# Patient Record
Sex: Male | Born: 1965
Health system: Southern US, Community
[De-identification: ages and names within clinical notes are randomized; demographics above are authoritative.]

## PROBLEM LIST (undated history)

## (undated) DIAGNOSIS — G8929 Other chronic pain: Secondary | ICD-10-CM

## (undated) DIAGNOSIS — D649 Anemia, unspecified: Secondary | ICD-10-CM

## (undated) DIAGNOSIS — IMO0001 Reserved for inherently not codable concepts without codable children: Secondary | ICD-10-CM

## (undated) DIAGNOSIS — S0592XA Unspecified injury of left eye and orbit, initial encounter: Secondary | ICD-10-CM

## (undated) DIAGNOSIS — M549 Dorsalgia, unspecified: Secondary | ICD-10-CM

## (undated) DIAGNOSIS — K219 Gastro-esophageal reflux disease without esophagitis: Secondary | ICD-10-CM

## (undated) DIAGNOSIS — R0789 Other chest pain: Secondary | ICD-10-CM

## (undated) DIAGNOSIS — E119 Type 2 diabetes mellitus without complications: Secondary | ICD-10-CM

## (undated) DIAGNOSIS — M1990 Unspecified osteoarthritis, unspecified site: Secondary | ICD-10-CM

## (undated) DIAGNOSIS — G43909 Migraine, unspecified, not intractable, without status migrainosus: Secondary | ICD-10-CM

## (undated) DIAGNOSIS — J45909 Unspecified asthma, uncomplicated: Secondary | ICD-10-CM

## (undated) DIAGNOSIS — F419 Anxiety disorder, unspecified: Secondary | ICD-10-CM

## (undated) HISTORY — DX: Anxiety disorder, unspecified: F41.9

## (undated) HISTORY — DX: Other chronic pain: G89.29

## (undated) HISTORY — DX: Dorsalgia, unspecified: M54.9

## (undated) HISTORY — PX: APPENDECTOMY: SHX54

## (undated) HISTORY — PX: CATARACT EXTRACTION: SUR2

## (undated) HISTORY — DX: Other chest pain: R07.89

## (undated) HISTORY — DX: Migraine, unspecified, not intractable, without status migrainosus: G43.909

## (undated) HISTORY — DX: Unspecified injury of left eye and orbit, initial encounter: S05.92XA

---

## 2004-10-09 ENCOUNTER — Ambulatory Visit (HOSPITAL_COMMUNITY): Admission: RE | Admit: 2004-10-09 | Discharge: 2004-10-09 | Payer: Self-pay | Admitting: Family Medicine

## 2005-06-25 ENCOUNTER — Ambulatory Visit (HOSPITAL_COMMUNITY): Admission: RE | Admit: 2005-06-25 | Discharge: 2005-06-25 | Payer: Self-pay | Admitting: Family Medicine

## 2010-01-16 DIAGNOSIS — F411 Generalized anxiety disorder: Secondary | ICD-10-CM

## 2010-01-17 ENCOUNTER — Ambulatory Visit: Payer: Self-pay | Admitting: Cardiology

## 2010-01-17 ENCOUNTER — Encounter (INDEPENDENT_AMBULATORY_CARE_PROVIDER_SITE_OTHER): Payer: Self-pay | Admitting: *Deleted

## 2010-01-17 DIAGNOSIS — G43909 Migraine, unspecified, not intractable, without status migrainosus: Secondary | ICD-10-CM | POA: Insufficient documentation

## 2010-01-17 DIAGNOSIS — R079 Chest pain, unspecified: Secondary | ICD-10-CM | POA: Insufficient documentation

## 2010-01-19 ENCOUNTER — Encounter (INDEPENDENT_AMBULATORY_CARE_PROVIDER_SITE_OTHER): Payer: Self-pay | Admitting: *Deleted

## 2010-01-19 ENCOUNTER — Encounter: Payer: Self-pay | Admitting: Cardiology

## 2010-01-19 LAB — CONVERTED CEMR LAB
ALT: 51 units/L (ref 0–53)
AST: 25 units/L
AST: 25 units/L (ref 0–37)
Albumin: 4.4 g/dL
Alkaline Phosphatase: 70 units/L
Calcium: 9.6 mg/dL
Calcium: 9.6 mg/dL (ref 8.4–10.5)
Chloride: 105 meq/L (ref 96–112)
Cholesterol: 195 mg/dL
Cholesterol: 195 mg/dL (ref 0–200)
Glucose, Bld: 96 mg/dL
HCT: 48.2 %
HCT: 48.2 % (ref 39.0–52.0)
HDL: 35 mg/dL
Hemoglobin: 16.4 g/dL (ref 13.0–17.0)
LDL Cholesterol: 137 mg/dL — ABNORMAL HIGH (ref 0–99)
Lymphs Abs: 1.5 10*3/uL (ref 0.7–4.0)
MCHC: 34 g/dL (ref 30.0–36.0)
Monocytes Absolute: 0.9 10*3/uL (ref 0.1–1.0)
Monocytes Relative: 11 % (ref 3–12)
Neutro Abs: 5 10*3/uL (ref 1.7–7.7)
Neutrophils Relative %: 61 % (ref 43–77)
Platelets: 297 10*3/uL
RBC: 5.15 M/uL (ref 4.22–5.81)
Sodium: 140 meq/L (ref 135–145)
TSH: 1.908 microintl units/mL
TSH: 1.908 microintl units/mL (ref 0.350–4.500)
Total Protein: 7.1 g/dL (ref 6.0–8.3)
Triglycerides: 117 mg/dL (ref <150)
WBC: 8.1 10*3/uL

## 2010-01-22 ENCOUNTER — Encounter (INDEPENDENT_AMBULATORY_CARE_PROVIDER_SITE_OTHER): Payer: Self-pay | Admitting: *Deleted

## 2010-01-24 ENCOUNTER — Ambulatory Visit: Payer: Self-pay | Admitting: Cardiology

## 2010-01-24 ENCOUNTER — Encounter (INDEPENDENT_AMBULATORY_CARE_PROVIDER_SITE_OTHER): Payer: Self-pay | Admitting: *Deleted

## 2010-01-25 ENCOUNTER — Encounter (INDEPENDENT_AMBULATORY_CARE_PROVIDER_SITE_OTHER): Payer: Self-pay | Admitting: *Deleted

## 2010-02-01 ENCOUNTER — Encounter (INDEPENDENT_AMBULATORY_CARE_PROVIDER_SITE_OTHER): Payer: Self-pay | Admitting: *Deleted

## 2010-02-07 ENCOUNTER — Ambulatory Visit (HOSPITAL_COMMUNITY): Admission: RE | Admit: 2010-02-07 | Discharge: 2010-02-07 | Payer: Self-pay | Admitting: Family Medicine

## 2010-11-19 NOTE — Letter (Signed)
Summary: [place] Results Engineer, agricultural at [facility]  [address] [place] [address]   Phone: [phone]  Fax: [phone]      January 22, 2010 MRN: [id number]   Dakota Anthony [address] [place], [place]  [address]   Dear [name],  Your test ordered by Selena Batten has been reviewed by your physician (or physician assistant) and was found to be normal or stable. Your physician (or physician assistant) felt no changes were needed at this time.  ____ Echocardiogram  ____ Cardiac Stress Test  _x___ Lab Work  ____ Peripheral vascular study of arms, legs or neck  ____ CT scan or X-ray  ____ Lung or Breathing test  ____ Other:  No change in medical treatment at this time, per Dr. Dietrich Pates.  Enclosed is a copy of your labwork for your records.  Thank you, [name] [name] RN    [place] [name], MD, Lenise Arena.C.Gaylord Shih, MD, F.A.C.C Lewayne Bunting, MD, F.A.C.C Nona Dell, MD, F.A.C.C Charlton Haws, MD, Lenise Arena.C.C

## 2010-11-19 NOTE — Assessment & Plan Note (Signed)
Summary: **PER DR.STEVE LUKING FOR CHEST PAIN/ABNORMAL EKG/TG   Visit Type:  Initial Consult Primary Provider:  Dr. Loran Senters   History of Present Illness: Initial visit for this very pleasant 45 year old gentleman kindly referred by Dr. Gerda Diss for evaluation of chest discomfort.  Mr. Lefebre has no history of cardiovascular disease and has never been evaluated by a cardiologist.  He did undergo treadmill testing 6 years ago with a negative result.  In recent weeks, he has been troubled by a sense of anxiety, has been perseverating on minor issues and has had difficulty sleeping.  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  Symptoms resolve with rest.  He was recently evaluated i.e. his primary care physician for these symptoms and was provided with a prescription for Xanax.  Insomnia has improved as has irritability.  EKG  Procedure date:  10-Feb-2010  Findings:      Normal sinus rhythm Right ventricular conduction delay Delayed R-wave progression Minor J-point elevation Otherwise normal EKG  Comparison with a prior tracing obtained in Dr. Fletcher Anon office shows less impressive changes of early repolarization on the current exam   Current Medications (verified): 1)  Aspir-Low 81 Mg Tbec (Aspirin) .... Take 1 Tab Daily 2)  Alprazolam 0.5 Mg Tabs (Alprazolam) .... Take 1 Tab Two Times A Day  Allergies (verified): No Known Drug Allergies  Past History:  Family History: Last updated: 02-10-2010 Father died at age 40 with a history of hepatic cirrhosis and the development of carcinoma. Mother died due to renal failure Siblings-one brother and one sister who are alive and well.  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  Family History: Father died at age 93 with a history of hepatic cirrhosis and the development of carcinoma. Mother died due to renal failure Siblings-one brother and one sister who are alive and well.  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  He requires corrective lenses for near vision.  There is a history of mild asthma.  All other systems reviewed and are negative.  Vital Signs:  Patient profile:   45 year old male Height:      73 inches Weight:      185 pounds BMI:     24.50 Pulse rate:   76 / minute BP sitting:   117 / 74  (right arm)  Vitals Entered By: Dreama Saa, CNA (February 10, 2010 11:13 AM)  Physical Exam  General:    Proportionatet weight and height; well-developed; no acute distress: HEENT-[place]/AT;anisocoria with the left pupil substantially larger but both reacting well to light and accommodation;  EOM intact; conjunctiva and lids nl:  Neck-No JVD; no carotid bruits: Endocrine-mild thyromegaly: Lungs-No tachypnea, clear without rales, rhonchi or wheezes: CV-normal PMI; normal S1 and S2; S4 present Abdomen-BS normal; soft and non-tender without masses or organomegaly: MS-No deformities, cyanosis or  clubbing: Neurologic-Nl cranial nerves; symmetric strength and tone: Skin- Warm, no sig. lesions: Extremities-Nl distal pulses; no edema    Impression & Recommendations:  Problem # 1:  CHEST PAIN (ICD-786.50) Mr. Eugine Bubb presents with atypical chest  discomfort.  His EKG abnormalities likely represent normal variants.  A stress test certainly is warranted to exclude ischemia and to evaluate his other symptoms, most notably exertional dyspnea and palpitations.  Basic blood tests including a TSH have also been obtained.  I will reassess this nice gentleman after the studies have been completed to determine whether any additional assessment or treatment is warranted.  Problem # 2:  ANXIETY (ICD-300.00) He appears to be responding well to Xanax.  Other Orders: Treadmill (Treadmill) Future Orders: T-Comprehensive Metabolic Panel ([id number]) ... 01/21/2010 T-CBC w/Diff ([id number]) ... 01/21/2010 T-Lipid Profile [id number]) ... 01/21/2010 T-TSH [id number]) ... 01/21/2010  Patient Instructions: 1)  Your physician recommends that you schedule a follow-up appointment in: 2 weeks 2)  Your physician recommends that you return for lab work in: next week 3)  Your physician has requested that you have an exercise tolerance test.  For further information please visit [email or web address]  Please also follow instruction sheet, as given.

## 2010-11-19 NOTE — Letter (Signed)
Summary: Graded Exercise Tolerance Test  [facility]  [address] [place] [address]   Phone: [phone]  Fax: [phone]        Graded Exercise Tolerance Test    Andria Meuse  Appointment Date:_  Appointment Time:_   Your doctor has ordered a stress test to help determine the condition of your  heart during exercise. If you take blood pressure medicine , ask your doctor if you should take it the day of your test. You may eat a light meal before your test.  Please be sure to bring the copy of your order with you.   You should dress comfortably, for example: Sweat pants, shorts, or skirt, Loose                                                                                                       short sleeved T-shirt, Rubber soled lace-up shoes (tennis shoes)  You will need to arrive 15 minutes before your appointment time. You will also need to enter at the Main Entrance of the hospital and go to the registration desk.   You will need to plan on being at the hospital for one hour from registration for this appointment.

## 2010-11-19 NOTE — Letter (Signed)
Summary: Appointment - Missed  Dakota Anthony at [facility]  [address], [place] [address]   Phone: [phone]  Fax: [phone]     February 01, 2010 MRN: [id number]   Dakota Anthony [address] [place], [place]  [address]   Dear Mr. Dakota Anthony,  Our records indicate you missed your appointment on   02/01/10                     with Dr.       .       Daleen Squibb                             It is very important that we reach you to reschedule this appointment. We look forward to participating in your health care needs. Please contact us at the number listed above at your earliest convenience to reschedule this appointment.     Sincerely,    Glass blower/designer

## 2010-11-19 NOTE — Letter (Signed)
Summary: [place] Results Engineer, agricultural at [facility]  [address], [place] [address]   Phone: [phone]  Fax: [phone]      January 25, 2010 MRN: [id number]   Dakota Anthony [address] [place], [place]  [address]   Dear Dakota Anthony,  Your test ordered by Selena Batten has been reviewed by your physician (or physician assistant) and was found to be normal or stable. Your physician (or physician assistant) felt no changes were needed at this time.  ____ Echocardiogram  __x__ Cardiac Stress Test  ____ Lab Work  ____ Peripheral vascular study of arms, legs or neck  ____ CT scan or X-ray  ____ Lung or Breathing test  ____ Other:  No change in medical treatment at this time, per Dr. Dietrich Pates.  Thank you, Shemekia Patane [name] RN    Stanly [name], MD, Lenise Arena.C.Gaylord Shih, MD, F.A.C.C Lewayne Bunting, MD, F.A.C.C Nona Dell, MD, F.A.C.C Charlton Haws, MD, Lenise Arena.C.C

## 2010-11-19 NOTE — Letter (Signed)
Summary: [place] [facility] Work Engineer, agricultural at [facility]  [address] [place] [address]   Phone: [phone]  Fax: [phone]     January 17, 2010 MRN: [id number]   [place] [address] [place], [place]  [address]      YOUR LAB WORK IS DUE   ________MONDAY_________________________________  Please go to [facility], located across the street from [facility] on the second floor.  Hours are Monday - Friday 7am until 7:30pm         Saturday 8am until 12noon    _x_  DO NOT EAT OR DRINK AFTER MIDNIGHT EVENING PRIOR TO LABWORK  __ YOUR LABWORK IS NOT FASTING --YOU MAY EAT PRIOR TO LABWORK

## 2010-11-19 NOTE — Miscellaneous (Signed)
Summary: LABS 4/2/2011CBCD,CMP,TSH,01/19/2010  Clinical Lists Changes  Observations: Added new observation of CALCIUM: 9.6 mg/dL (45/40/9811 91:47) Added new observation of ALBUMIN: 4.4 g/dL (82/95/6213 08:65) Added new observation of PROTEIN, TOT: 7.1 g/dL (78/46/9629 52:84) Added new observation of SGPT (ALT): 51 units/L (01/19/2010 11:38) Added new observation of SGOT (AST): 25 units/L (01/19/2010 11:38) Added new observation of ALK PHOS: 70 units/L (01/19/2010 11:38) Added new observation of CREATININE: 1.16 mg/dL (13/24/4010 27:25) Added new observation of BUN: 15 mg/dL (36/64/4034 74:25) Added new observation of BG RANDOM: 96 mg/dL (95/63/8756 43:32) Added new observation of CO2 PLSM/SER: 24 meq/L (01/19/2010 11:38) Added new observation of CL SERUM: 105 meq/L (01/19/2010 11:38) Added new observation of K SERUM: 4.4 meq/L (01/19/2010 11:38) Added new observation of NA: 140 meq/L (01/19/2010 11:38) Added new observation of LDL: 137 mg/dL (95/18/8416 60:63) Added new observation of HDL: 35 mg/dL (01/60/1093 23:55) Added new observation of TRIGLYC TOT: 117 mg/dL (73/22/0254 27:06) Added new observation of CHOLESTEROL: 195 mg/dL (23/76/2831 51:76) Added new observation of PLATELETK/UL: 297 K/uL (01/19/2010 11:38) Added new observation of MCV: 93.6 fL (01/19/2010 11:38) Added new observation of HCT: 48.2 % (01/19/2010 11:38) Added new observation of HGB: 16.4 g/dL (16/04/3709 62:69) Added new observation of WBC COUNT: 8.1 10*3/microliter (01/19/2010 11:38) Added new observation of TSH: 1.908 microintl units/mL (01/19/2010 11:38)

## 2011-03-07 NOTE — Procedures (Signed)
NAME:  Dakota Anthony, Dakota Anthony NO.:  [id number]   MEDICAL RECORD NO.:  [id number]          PATIENT TYPE:  OUT   LOCATION:  [facility]                          FACILITY:  [facility]   PHYSICIAN:  Donna Bernard, M.D.DATE OF BIRTH:  10-25-1965   DATE OF PROCEDURE:  DATE OF DISCHARGE:                                    STRESS TEST   INDICATION:  Atypical chest discomfort.   HOSPITAL COURSE:  A stress test was performed at standard [facility] protocol.  Resting EKG revealed normal sinus rhythm with no significant STT changes.  The patient tolerated the first three stages very well.  In fact, we had to  go all the way to the end of the fourth stage before he reached a submaximal  predicted heart rate of 154.  The patient went in fact 20 seconds into the  5th stage.  He reached a maximum heart rate of 174.  At this point, at 0.08  seconds past the J point, his ST segment was slightly depressed less than 1  mm with a sharply ascending slope which basically makes it within normal  limits.   IMPRESSION:  Normal adequate stress test.   PLAN:  1.  The patient is encouraged to maintain his level of exercise and physical      condition.  2.  Advil as needed for chest discomfort.  3.  The chance that this discomfort is cardiac is very low, and the patient      is advised of such.     Kristine Royal   WSL/MEDQ  D:  10/09/2004  T:  10/09/2004  Job:  [id number]

## 2011-08-12 ENCOUNTER — Encounter: Payer: Self-pay | Admitting: Cardiology

## 2011-09-08 ENCOUNTER — Other Ambulatory Visit: Payer: Self-pay | Admitting: Family Medicine

## 2011-09-08 DIAGNOSIS — R109 Unspecified abdominal pain: Secondary | ICD-10-CM

## 2011-09-09 ENCOUNTER — Ambulatory Visit (HOSPITAL_COMMUNITY)
Admission: RE | Admit: 2011-09-09 | Discharge: 2011-09-09 | Disposition: A | Discharge status: Home / Self Care | Payer: 59 | Source: Ambulatory Visit | Attending: Family Medicine | Admitting: Family Medicine

## 2011-09-09 DIAGNOSIS — R1011 Right upper quadrant pain: Secondary | ICD-10-CM | POA: Insufficient documentation

## 2011-09-09 DIAGNOSIS — K8020 Calculus of gallbladder without cholecystitis without obstruction: Secondary | ICD-10-CM | POA: Insufficient documentation

## 2011-09-09 DIAGNOSIS — R109 Unspecified abdominal pain: Secondary | ICD-10-CM

## 2011-10-09 NOTE — H&P (Signed)
NTS SOAP Note  Vital Signs:  Vitals as of: 10/09/2011: Systolic 133: Diastolic 74: Heart Rate 75: Temp 61F: Height 66ft 1in: Weight 188Lbs 0 Ounces: Pain Level 9: BMI 25  BMI : 24.8 kg/m2  Subjective: This 45 Years 64 Months old Male presents for of  ABDOMINAL ISSUES: ,Has been having right upper quadrant abdominal pain, bloating, and fatty food intolerance for the past six months.  Increasing in frequency.  No fever, chills, jaundice.  U/S of gallbladder reveals cholelithiasis with normal common bile duct.  Review of Symptoms:  Constitutional:unremarkable Head:unremarkable Eyes:unremarkable Nose/Mouth/Throat:unremarkable Cardiovascular:unremarkable Respiratory:unremarkable Gastrointestinal:unremarkable Genitourinary:unremarkable Musculoskeletal:unremarkable Skin:unremarkable Hematolgic/Lymphatic:unremarkable Allergic/Immunologic:unremarkable   Past Medical History:Reviewed   Past Medical History  Surgical History: none Medical Problems: none Psychiatric History:  Anxiety Allergies: nkda Medications: zanax, baby ASA   Social History:Reviewed   Social History  Preferred Language: English ([place]) Race:  White Ethnicity: Not Hispanic / Latino Age: 45 Years 11 Months Marital Status:  M Alcohol:  No Recreational drug(s):  No   Smoking Status: Never smoker reviewed on 10/09/2011  Family History:Reviewed   Family History              Father:  Cancer             Mother:  Diabetes Type II    Objective Information: General:Well appearing, well nourished in no distress. No scleral icterus Heart:RRR, no murmur or gallop.  Normal S1, S2.  No S3, S4.  Lungs:CTA bilaterally, no wheezes, rhonchi, rales.  Breathing unlabored. Abdomen:Soft, slightly tender in right upper quadrant to deep palpation, ND, normal bowel sounds, no HSM, no masses.  No peritoneal signs.  Assessment:Biliary colic,  cholelithiasis  Diagnosis &amp; Procedure: DiagnosisCode: 574.20, ProcedureCode: 54098,    Plan:Scheduled for laparoscopic cholecystectomy for 10/31/11.   Patient Education:Alternative treatments to surgery were discussed with patient (and family).Risks and benefits  of procedure were fully explained to the patient (and family) who gave informed consent. Patient/family questions were addressed.  Follow-up:Pending Surgery

## 2011-10-27 ENCOUNTER — Encounter (HOSPITAL_COMMUNITY)
Admission: RE | Admit: 2011-10-27 | Discharge: 2011-10-27 | Disposition: A | Discharge status: Home / Self Care | Payer: 59 | Source: Ambulatory Visit | Attending: General Surgery | Admitting: General Surgery

## 2011-10-27 ENCOUNTER — Encounter (HOSPITAL_COMMUNITY): Payer: Self-pay

## 2011-10-27 LAB — DIFFERENTIAL
Basophils Absolute: 0.1 10*3/uL (ref 0.0–0.1)
Lymphocytes Relative: 21 % (ref 12–46)
Monocytes Relative: 6 % (ref 3–12)
Neutro Abs: 5.3 10*3/uL (ref 1.7–7.7)
Neutrophils Relative %: 67 % (ref 43–77)

## 2011-10-27 LAB — BASIC METABOLIC PANEL
BUN: 18 mg/dL (ref 6–23)
Calcium: 10 mg/dL (ref 8.4–10.5)
GFR calc non Af Amer: 78 mL/min — ABNORMAL LOW (ref >90)
Sodium: 144 mEq/L (ref 135–145)

## 2011-10-27 LAB — CBC
HCT: 44 % (ref 39.0–52.0)
Hemoglobin: 14.7 g/dL (ref 13.0–17.0)
MCV: 92.8 fL (ref 78.0–100.0)
Platelets: 325 10*3/uL (ref 150–400)
RBC: 4.74 MIL/uL (ref 4.22–5.81)
RDW: 12.3 % (ref 11.5–15.5)

## 2011-10-27 LAB — HEPATIC FUNCTION PANEL
Alkaline Phosphatase: 89 U/L (ref 39–117)
Bilirubin, Direct: 0.1 mg/dL (ref 0.0–0.3)
Total Bilirubin: 0.4 mg/dL (ref 0.3–1.2)
Total Protein: 6.9 g/dL (ref 6.0–8.3)

## 2011-10-27 LAB — SURGICAL PCR SCREEN: Staphylococcus aureus: NEGATIVE

## 2011-10-27 NOTE — Patient Instructions (Addendum)
20 Dakota Anthony  10/27/2011   Your procedure is scheduled on:  Friday, 10/31/11  [facility] [place] [facility].  Call this number if you have problems the morning of surgery: [phone]   Remember:   Do not eat food:After Midnight.  May have clear liquids:until Midnight .  Clear liquids include soda, tea, black coffee, apple or grape juice, broth.  Take these medicines the morning of surgery with A SIP OF WATER: xanax   Do not wear jewelry, make-up or nail polish.  Do not wear lotions, powders, or perfumes. You may wear deodorant.  Do not bring valuables to the hospital.  Contacts, dentures or bridgework may not be worn into surgery.  Leave suitcase in the car. After surgery it may be brought to your room.  For patients admitted to the hospital, checkout time is 11:00 AM the day of discharge.   Patients discharged the day of surgery will not be allowed to drive home.  Name and phone number of your driver: driver  Special Instructions: CHG Shower Use Special Wash: 1/2 bottle night before surgery and 1/2 bottle morning of surgery.   Please read over the following fact sheets that you were given: Pain Booklet, MRSA Information, Surgical Site Infection Prevention, Anesthesia Post-op Instructions and Care and Recovery After Surgery     Laparoscopic Cholecystectomy Care After These instructions give you information on caring for yourself after your procedure. Your doctor may also give you more specific instructions. Call your doctor if you have any problems or questions after your procedure. HOME CARE  Change your bandages (dressings) as told by your doctor.   Keep the wound dry and clean. Wash the wound gently with soap and water. Pat the wound dry with a clean towel.   Do not take baths, swim, or use hot tubs for 10 days, or as told by your doctor.   Only take medicine as told by your doctor.   Eat a normal diet as told by your doctor.   Do not lift anything heavier than 25  pounds (11.5 kg), or as told by your doctor.   Do not play contact sports for 1 week, or as told by your doctor.  GET HELP RIGHT AWAY IF:   Your wound is red, puffy (swollen), or painful.   You have yellowish-white fluid (pus) coming from the wound.   You have fluid draining from the wound for more than 1 day.   You have a bad smell coming from the wound.   Your wound breaks open.   You have a rash.   You have trouble breathing.   You have chest pain.   You have a bad reaction to your medicine.   You have a fever.   You have pain in the shoulders (shoulder strap areas).   You feel dizzy or pass out (faint).   You have severe belly (abdominal) pain.   You feel sick to your stomach (nauseous) or throw up (vomit) for more than 1 day.  MAKE SURE YOU:  Understand these instructions.   Will watch your condition.   Will get help right away if you are not doing well or get worse.  Document Released: 07/15/2008 Document Revised: 06/18/2011 Document Reviewed: 03/25/2011 [facility] Patient Information 2012 [place], [place].

## 2011-10-31 ENCOUNTER — Other Ambulatory Visit: Payer: Self-pay | Admitting: General Surgery

## 2011-10-31 ENCOUNTER — Encounter (HOSPITAL_COMMUNITY): Payer: Self-pay | Admitting: Anesthesiology

## 2011-10-31 ENCOUNTER — Encounter (HOSPITAL_COMMUNITY): Payer: Self-pay | Admitting: *Deleted

## 2011-10-31 ENCOUNTER — Ambulatory Visit (HOSPITAL_COMMUNITY)
Admission: RE | Admit: 2011-10-31 | Discharge: 2011-10-31 | Disposition: A | Discharge status: Home / Self Care | Payer: 59 | Source: Ambulatory Visit | Attending: General Surgery | Admitting: General Surgery

## 2011-10-31 ENCOUNTER — Ambulatory Visit (HOSPITAL_COMMUNITY): Payer: 59 | Admitting: Anesthesiology

## 2011-10-31 ENCOUNTER — Encounter (HOSPITAL_COMMUNITY)
Admission: RE | Disposition: A | Discharge status: Home / Self Care | Payer: Self-pay | Source: Ambulatory Visit | Attending: General Surgery

## 2011-10-31 DIAGNOSIS — Z01812 Encounter for preprocedural laboratory examination: Secondary | ICD-10-CM | POA: Insufficient documentation

## 2011-10-31 DIAGNOSIS — K8010 Calculus of gallbladder with chronic cholecystitis without obstruction: Secondary | ICD-10-CM | POA: Insufficient documentation

## 2011-10-31 HISTORY — PX: CHOLECYSTECTOMY: SHX55

## 2011-10-31 SURGERY — LAPAROSCOPIC CHOLECYSTECTOMY
Anesthesia: General | Site: Abdomen | Wound class: Clean Contaminated

## 2011-10-31 MED ORDER — BUPIVACAINE HCL 0.5 % IJ SOLN
INTRAMUSCULAR | Status: DC | PRN
  Administered 2011-10-31: 10 mL

## 2011-10-31 MED ORDER — GLYCOPYRROLATE 0.2 MG/ML IJ SOLN
INTRAMUSCULAR | Status: AC
  Filled 2011-10-31: qty 1

## 2011-10-31 MED ORDER — FENTANYL CITRATE 0.05 MG/ML IJ SOLN
INTRAMUSCULAR | Status: AC
  Administered 2011-10-31: 25 ug via INTRAVENOUS
  Filled 2011-10-31: qty 2

## 2011-10-31 MED ORDER — BUPIVACAINE HCL (PF) 0.5 % IJ SOLN
INTRAMUSCULAR | Status: AC
  Filled 2011-10-31: qty 30

## 2011-10-31 MED ORDER — LIDOCAINE HCL (CARDIAC) 10 MG/ML IV SOLN
INTRAVENOUS | Status: DC | PRN
  Administered 2011-10-31: 10 mg via INTRAVENOUS

## 2011-10-31 MED ORDER — NEOSTIGMINE METHYLSULFATE 1 MG/ML IJ SOLN
INTRAMUSCULAR | Status: DC | PRN
  Administered 2011-10-31: 2 mg via INTRAVENOUS

## 2011-10-31 MED ORDER — ACETAMINOPHEN 325 MG PO TABS: 325.0000 mg | ORAL_TABLET | ORAL | Status: DC | PRN

## 2011-10-31 MED ORDER — ONDANSETRON HCL 4 MG/2ML IJ SOLN: 4.0000 mg | Freq: Once | INTRAMUSCULAR | Status: DC | PRN

## 2011-10-31 MED ORDER — MIDAZOLAM HCL 2 MG/2ML IJ SOLN
INTRAMUSCULAR | Status: AC
  Administered 2011-10-31: 2 mg via INTRAVENOUS
  Filled 2011-10-31: qty 2

## 2011-10-31 MED ORDER — ENOXAPARIN SODIUM 40 MG/0.4ML ~~LOC~~ SOLN
SUBCUTANEOUS | Status: AC
  Administered 2011-10-31: 40 mg via SUBCUTANEOUS
  Filled 2011-10-31: qty 0.4

## 2011-10-31 MED ORDER — ROCURONIUM BROMIDE 100 MG/10ML IV SOLN
INTRAVENOUS | Status: DC | PRN
  Administered 2011-10-31: 5 mg via INTRAVENOUS

## 2011-10-31 MED ORDER — GLYCOPYRROLATE 0.2 MG/ML IJ SOLN
INTRAMUSCULAR | Status: AC
  Administered 2011-10-31: 0.2 mg via INTRAVENOUS
  Filled 2011-10-31: qty 1

## 2011-10-31 MED ORDER — ENOXAPARIN SODIUM 40 MG/0.4ML ~~LOC~~ SOLN
40.0000 mg | Freq: Once | SUBCUTANEOUS | Status: AC
  Administered 2011-10-31: 40 mg via SUBCUTANEOUS

## 2011-10-31 MED ORDER — CEFAZOLIN SODIUM-DEXTROSE 2-3 GM-% IV SOLR
INTRAVENOUS | Status: AC
  Filled 2011-10-31: qty 50

## 2011-10-31 MED ORDER — FENTANYL CITRATE 0.05 MG/ML IJ SOLN
25.0000 ug | INTRAMUSCULAR | Status: DC | PRN
  Administered 2011-10-31 (×2): 25 ug via INTRAVENOUS

## 2011-10-31 MED ORDER — LACTATED RINGERS IV SOLN
INTRAVENOUS | Status: DC
  Administered 2011-10-31: 1000 mL via INTRAVENOUS

## 2011-10-31 MED ORDER — KETOROLAC TROMETHAMINE 30 MG/ML IJ SOLN
30.0000 mg | Freq: Once | INTRAMUSCULAR | Status: AC
  Administered 2011-10-31: 30 mg via INTRAVENOUS

## 2011-10-31 MED ORDER — KETOROLAC TROMETHAMINE 30 MG/ML IJ SOLN
INTRAMUSCULAR | Status: AC
  Filled 2011-10-31: qty 1

## 2011-10-31 MED ORDER — ONDANSETRON HCL 4 MG/2ML IJ SOLN
4.0000 mg | Freq: Once | INTRAMUSCULAR | Status: AC
  Administered 2011-10-31: 4 mg via INTRAVENOUS

## 2011-10-31 MED ORDER — ROCURONIUM BROMIDE 50 MG/5ML IV SOLN
INTRAVENOUS | Status: AC
  Filled 2011-10-31: qty 1

## 2011-10-31 MED ORDER — FENTANYL CITRATE 0.05 MG/ML IJ SOLN
INTRAMUSCULAR | Status: AC
  Administered 2011-10-31: 25 ug via INTRAVENOUS
  Filled 2011-10-31: qty 5

## 2011-10-31 MED ORDER — FENTANYL CITRATE 0.05 MG/ML IJ SOLN
INTRAMUSCULAR | Status: DC | PRN
  Administered 2011-10-31 (×3): 50 ug via INTRAVENOUS

## 2011-10-31 MED ORDER — LIDOCAINE HCL (PF) 1 % IJ SOLN
INTRAMUSCULAR | Status: AC
  Filled 2011-10-31: qty 5

## 2011-10-31 MED ORDER — CEFAZOLIN SODIUM-DEXTROSE 2-3 GM-% IV SOLR
2.0000 g | INTRAVENOUS | Status: AC
  Administered 2011-10-31: 2 g via INTRAVENOUS

## 2011-10-31 MED ORDER — ONDANSETRON HCL 4 MG/2ML IJ SOLN
INTRAMUSCULAR | Status: AC
  Administered 2011-10-31: 4 mg via INTRAVENOUS
  Filled 2011-10-31: qty 2

## 2011-10-31 MED ORDER — PROPOFOL 10 MG/ML IV EMUL
INTRAVENOUS | Status: DC | PRN
  Administered 2011-10-31: 50 mg via INTRAVENOUS
  Administered 2011-10-31: 150 mg via INTRAVENOUS

## 2011-10-31 MED ORDER — MIDAZOLAM HCL 2 MG/2ML IJ SOLN
1.0000 mg | INTRAMUSCULAR | Status: DC | PRN
  Administered 2011-10-31: 2 mg via INTRAVENOUS

## 2011-10-31 MED ORDER — OXYCODONE-ACETAMINOPHEN 7.5-325 MG PO TABS: 1.0000 | ORAL_TABLET | ORAL | Status: AC | PRN

## 2011-10-31 MED ORDER — GLYCOPYRROLATE 0.2 MG/ML IJ SOLN
0.2000 mg | Freq: Once | INTRAMUSCULAR | Status: AC | PRN
  Administered 2011-10-31: 0.2 mg via INTRAVENOUS

## 2011-10-31 MED ORDER — HEMOSTATIC AGENTS (NO CHARGE) OPTIME
TOPICAL | Status: DC | PRN
  Administered 2011-10-31: 1 via TOPICAL

## 2011-10-31 MED ORDER — PROPOFOL 10 MG/ML IV EMUL
INTRAVENOUS | Status: AC
  Filled 2011-10-31: qty 20

## 2011-10-31 MED ORDER — GLYCOPYRROLATE 0.2 MG/ML IJ SOLN
INTRAMUSCULAR | Status: DC | PRN
  Administered 2011-10-31: .4 mg via INTRAVENOUS

## 2011-10-31 MED ORDER — SODIUM CHLORIDE 0.9 % IR SOLN
Status: DC | PRN
  Administered 2011-10-31: 1000 mL

## 2011-10-31 MED ORDER — NEOSTIGMINE METHYLSULFATE 1 MG/ML IJ SOLN
INTRAMUSCULAR | Status: AC
  Filled 2011-10-31: qty 10

## 2011-10-31 SURGICAL SUPPLY — 35 items
APPLIER CLIP ROT 10 11.4 M/L (STAPLE) ×2
APR CLP MED LRG 11.4X10 (STAPLE) ×1
BAG HAMPER (MISCELLANEOUS) ×2 IMPLANT
BAG SPEC RTRVL LRG 6X4 10 (ENDOMECHANICALS) ×1
CLIP APPLIE ROT 10 11.4 M/L (STAPLE) ×1 IMPLANT
CLOTH BEACON ORANGE TIMEOUT ST (SAFETY) ×2 IMPLANT
COVER LIGHT HANDLE STERIS (MISCELLANEOUS) ×4 IMPLANT
DECANTER SPIKE VIAL GLASS SM (MISCELLANEOUS) ×2 IMPLANT
DURAPREP 26ML APPLICATOR (WOUND CARE) ×2 IMPLANT
ELECT REM PT RETURN 9FT ADLT (ELECTROSURGICAL) ×2
ELECTRODE REM PT RTRN 9FT ADLT (ELECTROSURGICAL) ×1 IMPLANT
FILTER SMOKE EVAC LAPAROSHD (FILTER) ×2 IMPLANT
FORMALIN 10 PREFIL 120ML (MISCELLANEOUS) ×2 IMPLANT
GLOVE BIO SURGEON STRL SZ7.5 (GLOVE) ×2 IMPLANT
GLOVE ECLIPSE 6.5 STRL STRAW (GLOVE) ×1 IMPLANT
GLOVE ECLIPSE 7.0 STRL STRAW (GLOVE) ×1 IMPLANT
GLOVE EXAM NITRILE MD LF STRL (GLOVE) ×1 IMPLANT
GLOVE INDICATOR 7.0 STRL GRN (GLOVE) ×2 IMPLANT
GOWN STRL REIN XL XLG (GOWN DISPOSABLE) ×6 IMPLANT
HEMOSTAT SNOW SURGICEL 2X4 (HEMOSTASIS) ×2 IMPLANT
INST SET LAPROSCOPIC AP (KITS) ×2 IMPLANT
KIT ROOM TURNOVER APOR (KITS) ×2 IMPLANT
KIT TROCAR LAP CHOLE (TROCAR) ×2 IMPLANT
MANIFOLD NEPTUNE II (INSTRUMENTS) ×2 IMPLANT
NS IRRIG 1000ML POUR BTL (IV SOLUTION) ×2 IMPLANT
PACK LAP CHOLE LZT030E (CUSTOM PROCEDURE TRAY) ×2 IMPLANT
PAD ARMBOARD 7.5X6 YLW CONV (MISCELLANEOUS) ×2 IMPLANT
POUCH SPECIMEN RETRIEVAL 10MM (ENDOMECHANICALS) ×2 IMPLANT
SET BASIN LINEN APH (SET/KITS/TRAYS/PACK) ×2 IMPLANT
SPONGE GAUZE 2X2 8PLY STRL LF (GAUZE/BANDAGES/DRESSINGS) ×5 IMPLANT
STAPLER VISISTAT (STAPLE) ×2 IMPLANT
SUT VICRYL 0 UR6 27IN ABS (SUTURE) ×2 IMPLANT
TAPE CLOTH SURG 4X10 WHT LF (GAUZE/BANDAGES/DRESSINGS) ×1 IMPLANT
WARMER LAPAROSCOPE (MISCELLANEOUS) ×2 IMPLANT
YANKAUER SUCT 12FT TUBE ARGYLE (SUCTIONS) ×2 IMPLANT

## 2011-10-31 NOTE — Anesthesia Preprocedure Evaluation (Signed)
Anesthesia Evaluation  Patient identified by MRN, date of birth, ID band Patient awake    Reviewed: Allergy & Precautions, H&P , NPO status , Patient's Chart, lab work & pertinent test results  Airway Mallampati: I TM Distance: >3 FB Neck ROM: Full    Dental No notable dental hx.    Pulmonary neg pulmonary ROS,    Pulmonary exam normal       Cardiovascular neg cardio ROS Regular Normal    Neuro/Psych  Headaches, Anxiety    GI/Hepatic negative GI ROS, Neg liver ROS,   Endo/Other  Negative Endocrine ROS  Renal/GU negative Renal ROS  Genitourinary negative   Musculoskeletal negative musculoskeletal ROS (+)   Abdominal Normal abdominal exam  (+)   Peds  Hematology negative hematology ROS (+)   Anesthesia Other Findings   Reproductive/Obstetrics                           Anesthesia Physical Anesthesia Plan  ASA: I  Anesthesia Plan: General   Post-op Pain Management:    Induction: Intravenous, Rapid sequence and Cricoid pressure planned  Airway Management Planned: Oral ETT  Additional Equipment:   Intra-op Plan:   Post-operative Plan: Extubation in OR  Informed Consent: I have reviewed the patients History and Physical, chart, labs and discussed the procedure including the risks, benefits and alternatives for the proposed anesthesia with the patient or authorized representative who has indicated his/her understanding and acceptance.   Dental advisory given  Plan Discussed with: CRNA  Anesthesia Plan Comments:         Anesthesia Quick Evaluation

## 2011-10-31 NOTE — Interval H&P Note (Signed)
History and Physical Interval Note:  10/31/2011 7:24 AM  Dakota Anthony  has presented today for surgery, with the diagnosis of [place] of gallbladder with other cholecystitis, without mention of obstruction   The various methods of treatment have been discussed with the patient and family. After consideration of risks, benefits and other options for treatment, the patient has consented to  Procedure(s): LAPAROSCOPIC CHOLECYSTECTOMY as a surgical intervention .  The patients' history has been reviewed, patient examined, no change in status, stable for surgery.  I have reviewed the patients' chart and labs.  Questions were answered to the patient's satisfaction.     Franky Macho A

## 2011-10-31 NOTE — Op Note (Signed)
Patient:  Dakota Anthony  DOB:  08-31-1966  MRN:  [id number]   Preop Diagnosis:  Cholecystitis, cholelithiasis  Postop Diagnosis:  Same  Procedure:  Laparoscopic cholecystectomy  Surgeon:  Franky Macho, M.D.  Anes:  General endotracheal  Indications:  Patient is a 46 year old white male who presents with biliary colic secondary to cholelithiasis. The risks and benefits of the procedure including bleeding, infection, hepatobiliary injury, the possibly of an open procedure were fully explained to the patient, gave informed consent.  Procedure note:  Patient is placed the supine position. After induction of general endotracheal anesthesia, the abdomen was prepped and draped using usual sterile technique with DuraPrep. Surgical site confirmation was performed.  A supraumbilical incision was made down to the fascia. A Veress needle was introduced into the abdominal cavity and confirmation of placement was done using the saline drop test. The abdomen was then insufflated to 16 mm mercury pressure. An 11 mm trocar was introduced into the abdominal cavity under direct visualization without difficulty. The patient was placed in reverse Trendelenburg position and additional 11 mm trocar was placed the epigastric region 5 mm trochars placed were upper quadrant and right flank regions. It was noted that a small section small bowel mesentery had some pneumatosis present. This was not the wall of the bowel. This was inspected at the end of the procedure and again, no injury to the small bowel was noted. The liver was inspected and noted within normal limits. The gallbladder was retracted superior laterally. Dissection was begun around the infundibulum the gallbladder. The cystic duct was first identified. Junction to the infundibulum flow identified. Endoclips were placed proximally distally on the cystic duct and cystic duct was divided. This likewise done cystic artery. The gallbladder was then freed away  from the gallbladder fossa using Bovie electrocautery. The gallbladder delivered through the epigastric trocar site using an Endo Catch bag. The gallbladder fossa was inspected no abnormal bleeding or bile leakage was noted. Surgicel is placed the gallbladder fossa. All fluid and air were then evacuated from the abdominal cavity prior to removal of the trochars.  All wounds were gave normal saline. All wounds were injected 0.5% Sensorcaine. The supraumbilical fascia as well as epigastric fascia reapproximated using 0 Vicryl interrupted sutures. All skin incisions were closed using staples. Betadine ointment after dressings were applied.  All tape and needle counts were correct the end of the procedure. Patient was extubated in the operating room and went back to recovery room awake in stable condition.  Complications:  None  EBL:  Minimal  Specimen:  Gallbladder

## 2011-10-31 NOTE — Anesthesia Procedure Notes (Signed)
Procedure Name: Intubation Date/Time: 10/31/2011 7:48 AM Performed by: Minerva Areola Pre-anesthesia Checklist: Patient identified, Patient being monitored, Timeout performed, Emergency Drugs available and Suction available Patient Re-evaluated:Patient Re-evaluated prior to inductionOxygen Delivery Method: Circle System Utilized Preoxygenation: Pre-oxygenation with 100% oxygen Intubation Type: Rapid sequence and Cricoid Pressure applied Ventilation: Mask ventilation without difficulty Laryngoscope Size: Miller and 2 Grade View: Grade I Tube type: Oral Tube size: 7.0 mm Number of attempts: 1 Airway Equipment and Method: stylet Placement Confirmation: ETT inserted through vocal cords under direct vision,  positive ETCO2 and breath sounds checked- equal and bilateral Secured at: 21 cm Tube secured with: Tape Dental Injury: Teeth and Oropharynx as per pre-operative assessment

## 2011-10-31 NOTE — Transfer of Care (Signed)
Immediate Anesthesia Transfer of Care Note  Patient: Dakota Anthony  Procedure(s) Performed:  LAPAROSCOPIC CHOLECYSTECTOMY  Patient Location: PACU  Anesthesia Type: General  Level of Consciousness: awake  Airway & Oxygen Therapy: Patient Spontanous Breathing and non-rebreather face mask  Post-op Assessment: Report given to PACU RN, Post -op Vital signs reviewed and stable and Patient moving all extremities  Post vital signs: Reviewed and stable  Complications: No apparent anesthesia complications

## 2011-10-31 NOTE — Anesthesia Postprocedure Evaluation (Signed)
Anesthesia Post Note  Patient: Dakota Anthony  Procedure(s) Performed:  LAPAROSCOPIC CHOLECYSTECTOMY  Anesthesia type: General  Patient location: PACU  Post pain: Pain level controlled  Post assessment: Post-op Vital signs reviewed, Patient's Cardiovascular Status Stable, Respiratory Function Stable, Patent Airway, No signs of Nausea or vomiting and Pain level controlled  Last Vitals:  Filed Vitals:   10/31/11 0840  BP: 140/87  Pulse:   Temp: 36.8 C  Resp: 12    Post vital signs: Reviewed and stable HR 53  Level of consciousness: awake and alert   Complications: No apparent anesthesia complications

## 2011-11-03 ENCOUNTER — Encounter (HOSPITAL_COMMUNITY): Payer: Self-pay | Admitting: General Surgery

## 2012-07-14 ENCOUNTER — Other Ambulatory Visit: Payer: Self-pay | Admitting: Family Medicine

## 2012-07-14 ENCOUNTER — Ambulatory Visit (HOSPITAL_COMMUNITY)
Admission: RE | Admit: 2012-07-14 | Discharge: 2012-07-14 | Disposition: A | Discharge status: Home / Self Care | Payer: 59 | Source: Ambulatory Visit | Attending: Family Medicine | Admitting: Family Medicine

## 2012-07-14 DIAGNOSIS — M79671 Pain in right foot: Secondary | ICD-10-CM

## 2012-07-14 DIAGNOSIS — R609 Edema, unspecified: Secondary | ICD-10-CM | POA: Insufficient documentation

## 2012-07-14 DIAGNOSIS — M79609 Pain in unspecified limb: Secondary | ICD-10-CM | POA: Insufficient documentation

## 2013-01-28 ENCOUNTER — Encounter: Payer: Self-pay | Admitting: *Deleted

## 2013-02-03 ENCOUNTER — Ambulatory Visit (INDEPENDENT_AMBULATORY_CARE_PROVIDER_SITE_OTHER): Payer: Managed Care, Other (non HMO) | Admitting: Family Medicine

## 2013-02-03 ENCOUNTER — Encounter: Payer: Self-pay | Admitting: Family Medicine

## 2013-02-03 VITALS — BP 120/80 | HR 70 | Ht 73.0 in | Wt 196.0 lb

## 2013-02-03 DIAGNOSIS — F411 Generalized anxiety disorder: Secondary | ICD-10-CM

## 2013-02-03 DIAGNOSIS — G43909 Migraine, unspecified, not intractable, without status migrainosus: Secondary | ICD-10-CM

## 2013-02-03 DIAGNOSIS — R079 Chest pain, unspecified: Secondary | ICD-10-CM

## 2013-02-03 NOTE — Progress Notes (Signed)
  Subjective:    Patient ID: Dakota Anthony., male    DOB: Jan 06, 1966, 47 y.o.   MRN: [id number]  Anxiety Presents for follow-up visit. Symptoms include excessive worry, irritability, nervous/anxious behavior and restlessness. Symptoms occur occasionally. The severity of symptoms is moderate. The patient sleeps 6 hours per night. The quality of sleep is fair. Nighttime awakenings: occasional.     Also under increased stress with recent separation from wife. Some trouble sleeping at night. Occasional shortness of breath with excessive exertion.   Review of Systems  Constitutional: Positive for irritability.  Psychiatric/Behavioral: The patient is nervous/anxious.       ROS otherwise negative. Objective:   Physical Exam  Alert no acute distress. Vitals normal. Lungs clear. Heart regular in rhythm. Ankles without edema.      Assessment & Plan:  1 insomnia. #2 dyspnea likely deconditioning. #3 anxiety ongoing. Plan meds refilled. Recheck in 6 months. Screening physical exam encourage. WSL

## 2013-05-10 ENCOUNTER — Encounter: Payer: Self-pay | Admitting: Family Medicine

## 2013-05-10 ENCOUNTER — Ambulatory Visit (INDEPENDENT_AMBULATORY_CARE_PROVIDER_SITE_OTHER): Payer: Managed Care, Other (non HMO) | Admitting: Family Medicine

## 2013-05-10 VITALS — BP 122/80 | Temp 98.1°F | Wt 192.0 lb

## 2013-05-10 DIAGNOSIS — M25511 Pain in right shoulder: Secondary | ICD-10-CM

## 2013-05-10 DIAGNOSIS — M25519 Pain in unspecified shoulder: Secondary | ICD-10-CM

## 2013-05-10 MED ORDER — DICLOFENAC SODIUM 75 MG PO TBEC: 75.0000 mg | DELAYED_RELEASE_TABLET | Freq: Two times a day (BID) | ORAL | Status: DC

## 2013-05-10 NOTE — Progress Notes (Signed)
  Subjective:    Patient ID: Dakota Anthony., male    DOB: 1965-12-22, 47 y.o.   MRN: [id number]  HPI  Two mo ago developed knot right breast. Shoulder pain. Aching at times  Feels like sore muscle.  Does not over do it.  With job. A lot of climbing but no major lifting. Neg otc meds.  Feels w "weird"  Review of Systems No back pain no chest pain no shortness of breath ROS otherwise negative    Objective:   Physical Exam  Alert no acute distress. Neck supple. Shoulder good range of motion. Some suprascapular shoulder tenderness. Right anterior breast small subcutaneous tiny cyst      Assessment & Plan:  Impression. Ariel where superficial cutaneous cyst discussed no need for further workup. #2 subacute musculoskeletal right shoulder pain. There may be a neuropathic element with the pain extending to the right forearm. Plan Voltaren twice a day with food when necessary for shoulder and arm pain. No further workup of skin cysts. WSL

## 2013-10-10 ENCOUNTER — Ambulatory Visit (INDEPENDENT_AMBULATORY_CARE_PROVIDER_SITE_OTHER): Payer: Managed Care, Other (non HMO) | Admitting: Family Medicine

## 2013-10-10 ENCOUNTER — Encounter: Payer: Self-pay | Admitting: Family Medicine

## 2013-10-10 VITALS — BP 128/80 | Temp 98.4°F | Ht 73.0 in | Wt 195.0 lb

## 2013-10-10 DIAGNOSIS — J45909 Unspecified asthma, uncomplicated: Secondary | ICD-10-CM

## 2013-10-10 DIAGNOSIS — J209 Acute bronchitis, unspecified: Secondary | ICD-10-CM

## 2013-10-10 DIAGNOSIS — J329 Chronic sinusitis, unspecified: Secondary | ICD-10-CM

## 2013-10-10 DIAGNOSIS — J683 Other acute and subacute respiratory conditions due to chemicals, gases, fumes and vapors: Secondary | ICD-10-CM

## 2013-10-10 MED ORDER — CEFPROZIL 500 MG PO TABS: 500.0000 mg | ORAL_TABLET | Freq: Two times a day (BID) | ORAL | Status: DC

## 2013-10-10 MED ORDER — CEFTRIAXONE SODIUM 1 G IJ SOLR
500.0000 mg | Freq: Once | INTRAMUSCULAR | Status: AC
  Administered 2013-10-10: 500 mg via INTRAMUSCULAR

## 2013-10-10 MED ORDER — ALBUTEROL SULFATE HFA 108 (90 BASE) MCG/ACT IN AERS: 2.0000 | INHALATION_SPRAY | Freq: Four times a day (QID) | RESPIRATORY_TRACT | Status: DC | PRN

## 2013-10-10 NOTE — Progress Notes (Signed)
   Subjective:    Patient ID: Dakota Anthony., male    DOB: 1966/01/21, 47 y.o.   MRN: [id number]  Diarrhea  This is a new problem. The current episode started in the past 7 days. Associated symptoms comments: Hot flashes, dizziness, dry eyes at night, nasal congestion, chest tightness, shortness of breath, cough. Treatments tried: nyquil, tylenol flu. The treatment provided mild relief.   Sharp pain in left leg 2-3 weeks. Happens when lying down.   Cough is deeper. Productive at times. Progressive over the past week.  Diminished energy with it. Notes wheezy sensation at times.   Review of Systems  Gastrointestinal: Positive for diarrhea.   No vomiting no rash ROS otherwise negative    Objective:   Physical Exam  Alert hydration good. HEENT moderate nasal congestion. Pharynx normal. Lungs bilateral wheezes with cough no crackles no tachypnea heart regular in rhythm.      Assessment & Plan:  Impression 1 bronchitis with reactive airways plan Ventolin 2 sprays 4 times a day. Cefzil twice a day 10 days. Symptomatic care discussed. Work excuse written. WSL

## 2013-11-06 ENCOUNTER — Other Ambulatory Visit: Payer: Self-pay | Admitting: Family Medicine

## 2013-11-07 ENCOUNTER — Telehealth: Payer: Self-pay | Admitting: Family Medicine

## 2013-11-07 NOTE — Telephone Encounter (Signed)
Ok plus 2 ref 

## 2013-11-07 NOTE — Telephone Encounter (Signed)
Ok plus 2 monthly ref 

## 2013-11-07 NOTE — Telephone Encounter (Signed)
Pt needs refill on his alprazolam, refills left on Rx expired, 1mg  last filled 07/15/13, [facility]

## 2013-11-07 NOTE — Telephone Encounter (Signed)
Med sent, patient notified.  

## 2014-01-19 ENCOUNTER — Encounter: Payer: Self-pay | Admitting: Family Medicine

## 2014-01-19 ENCOUNTER — Ambulatory Visit (INDEPENDENT_AMBULATORY_CARE_PROVIDER_SITE_OTHER): Payer: Managed Care, Other (non HMO) | Admitting: Family Medicine

## 2014-01-19 VITALS — BP 114/80 | Ht 71.0 in | Wt 190.0 lb

## 2014-01-19 DIAGNOSIS — Z0000 Encounter for general adult medical examination without abnormal findings: Secondary | ICD-10-CM

## 2014-01-19 DIAGNOSIS — Z79899 Other long term (current) drug therapy: Secondary | ICD-10-CM

## 2014-01-19 DIAGNOSIS — Z125 Encounter for screening for malignant neoplasm of prostate: Secondary | ICD-10-CM

## 2014-01-19 DIAGNOSIS — G8929 Other chronic pain: Secondary | ICD-10-CM

## 2014-01-19 DIAGNOSIS — M549 Dorsalgia, unspecified: Secondary | ICD-10-CM

## 2014-01-19 DIAGNOSIS — Z800 Family history of malignant neoplasm of digestive organs: Secondary | ICD-10-CM

## 2014-01-19 LAB — POCT URINALYSIS DIPSTICK: PH UA: 5

## 2014-01-19 MED ORDER — ALPRAZOLAM 1 MG PO TABS
ORAL_TABLET | ORAL | Status: DC
Start: 2014-01-19 — End: 2014-07-27

## 2014-01-19 NOTE — Progress Notes (Signed)
   Subjective:    Patient ID: Dakota Anthony., male    DOB: [birth year], 48 y.o.   MRN: [id number]  HPI Patient is here today for his annual wellness exam. Patient states he has back pain that has been present for about 3 months. Patient notes low back pain. Worse with certain motions. Does some heavy lifting with his job.   fam hx colon cancer multiple unncles  BMs good  No sig urinating at night  definietly needs anxiety tabs. Without has a lot of difficulty.  No tobacco ,,no wheezing lately  Patient notes a substantial family history of colon cancer. He has not had his colon scoped as of yet. No obvious change in bowel habits. No blood in stool.  Review of Systems  Constitutional: Negative for fever, activity change and appetite change.  HENT: Negative for congestion and rhinorrhea.   Eyes: Negative for discharge.  Respiratory: Positive for wheezing. Negative for cough.   Cardiovascular: Negative for chest pain.  Gastrointestinal: Negative for vomiting, abdominal pain and blood in stool.  Genitourinary: Negative for frequency and difficulty urinating.  Musculoskeletal: Positive for back pain. Negative for neck pain.       See present illness lumbar pain for several months.  Skin: Negative for rash.  Allergic/Immunologic: Negative for environmental allergies and food allergies.  Neurological: Negative for weakness and headaches.  Psychiatric/Behavioral: Negative for agitation.  All other systems reviewed and are negative.       Objective:   Physical Exam  Vitals reviewed. Constitutional: He appears well-developed and well-nourished.  HENT:  Head: Normocephalic and atraumatic.  Right Ear: External ear normal.  Left Ear: External ear normal.  Nose: Nose normal.  Mouth/Throat: Oropharynx is clear and moist.  Eyes: EOM are normal. Pupils are equal, round, and reactive to light.  Neck: Normal range of motion. Neck supple. No thyromegaly present.  Cardiovascular:  Normal rate, regular rhythm and normal heart sounds.   No murmur heard. Pulmonary/Chest: Effort normal and breath sounds normal. No respiratory distress. He has no wheezes.  Abdominal: Soft. Bowel sounds are normal. He exhibits no distension and no mass. There is no tenderness.  Genitourinary: Penis normal.  Musculoskeletal: Normal range of motion. He exhibits no edema.  Lymphadenopathy:    He has no cervical adenopathy.  Neurological: He is alert. He exhibits normal muscle tone.  Skin: Skin is warm and dry. No erythema.  Psychiatric: He has a normal mood and affect. His behavior is normal. Judgment normal.          Assessment & Plan:  Impression 1 wellness exam. #2 history of wheezing stable. #3 chronic anxiety. #4 subacute low back pain. Plan due to persistent back discomfort and very strong family history of colon cancer with several uncles with it recommend consideration for colonoscopy. Medications refilled. Low back x-rays. GI consult for early colonoscopy. WSL

## 2014-01-23 ENCOUNTER — Ambulatory Visit (HOSPITAL_COMMUNITY)
Admission: RE | Admit: 2014-01-23 | Discharge: 2014-01-23 | Disposition: A | Discharge status: Home / Self Care | Payer: 59 | Source: Ambulatory Visit | Attending: Family Medicine | Admitting: Family Medicine

## 2014-01-23 DIAGNOSIS — M51379 Other intervertebral disc degeneration, lumbosacral region without mention of lumbar back pain or lower extremity pain: Secondary | ICD-10-CM | POA: Insufficient documentation

## 2014-01-23 DIAGNOSIS — M5137 Other intervertebral disc degeneration, lumbosacral region: Secondary | ICD-10-CM | POA: Insufficient documentation

## 2014-01-23 DIAGNOSIS — G8929 Other chronic pain: Secondary | ICD-10-CM | POA: Insufficient documentation

## 2014-01-23 LAB — HEPATIC FUNCTION PANEL
ALBUMIN: 4.3 g/dL (ref 3.5–5.2)
ALK PHOS: 70 U/L (ref 39–117)
ALT: 45 U/L (ref 0–53)
AST: 23 U/L (ref 0–37)
BILIRUBIN TOTAL: 0.5 mg/dL (ref 0.2–1.2)
Bilirubin, Direct: 0.1 mg/dL (ref 0.0–0.3)
Indirect Bilirubin: 0.4 mg/dL (ref 0.2–1.2)
Total Protein: 7 g/dL (ref 6.0–8.3)

## 2014-01-23 LAB — BASIC METABOLIC PANEL
BUN: 15 mg/dL (ref 6–23)
CALCIUM: 9.6 mg/dL (ref 8.4–10.5)
CO2: 27 meq/L (ref 19–32)
CREATININE: 1.1 mg/dL (ref 0.50–1.35)
Chloride: 106 mEq/L (ref 96–112)
GLUCOSE: 92 mg/dL (ref 70–99)
Potassium: 4.8 mEq/L (ref 3.5–5.3)
Sodium: 140 mEq/L (ref 135–145)

## 2014-01-23 LAB — LIPID PANEL
Cholesterol: 186 mg/dL (ref 0–200)
HDL: 35 mg/dL — AB (ref >39)
LDL Cholesterol: 132 mg/dL — ABNORMAL HIGH (ref 0–99)
Total CHOL/HDL Ratio: 5.3 Ratio
Triglycerides: 93 mg/dL (ref <150)
VLDL: 19 mg/dL (ref 0–40)

## 2014-01-24 ENCOUNTER — Encounter (INDEPENDENT_AMBULATORY_CARE_PROVIDER_SITE_OTHER): Payer: Self-pay | Admitting: *Deleted

## 2014-01-24 LAB — PSA: PSA: 3.11 ng/mL (ref <=4.00)

## 2014-02-21 ENCOUNTER — Telehealth: Payer: Self-pay | Admitting: Family Medicine

## 2014-02-21 MED ORDER — ALBUTEROL SULFATE HFA 108 (90 BASE) MCG/ACT IN AERS: 2.0000 | INHALATION_SPRAY | Freq: Four times a day (QID) | RESPIRATORY_TRACT | Status: DC | PRN

## 2014-02-21 NOTE — Telephone Encounter (Signed)
Patient needs Rx for albuterol inhaler to [facility].

## 2014-02-21 NOTE — Telephone Encounter (Signed)
Medication sent to pharmacy. Left message on voicemail notifying patient.  

## 2014-07-20 ENCOUNTER — Encounter (INDEPENDENT_AMBULATORY_CARE_PROVIDER_SITE_OTHER): Payer: Self-pay | Admitting: *Deleted

## 2014-07-27 ENCOUNTER — Other Ambulatory Visit: Payer: Self-pay | Admitting: Family Medicine

## 2014-07-27 NOTE — Telephone Encounter (Signed)
Last seen 01/19/14

## 2014-07-27 NOTE — Telephone Encounter (Signed)
One mo worth 6 mo visit due

## 2016-01-02 ENCOUNTER — Observation Stay (HOSPITAL_COMMUNITY)
Admission: EM | Admit: 2016-01-02 | Discharge: 2016-01-04 | Disposition: A | Discharge status: Home / Self Care | Payer: BLUE CROSS/BLUE SHIELD | Attending: General Surgery | Admitting: General Surgery

## 2016-01-02 ENCOUNTER — Encounter (HOSPITAL_COMMUNITY): Payer: Self-pay

## 2016-01-02 DIAGNOSIS — K3580 Unspecified acute appendicitis: Secondary | ICD-10-CM | POA: Diagnosis not present

## 2016-01-02 DIAGNOSIS — Z8669 Personal history of other diseases of the nervous system and sense organs: Secondary | ICD-10-CM | POA: Diagnosis not present

## 2016-01-02 DIAGNOSIS — F17200 Nicotine dependence, unspecified, uncomplicated: Secondary | ICD-10-CM | POA: Insufficient documentation

## 2016-01-02 DIAGNOSIS — Z7982 Long term (current) use of aspirin: Secondary | ICD-10-CM | POA: Insufficient documentation

## 2016-01-02 DIAGNOSIS — Z79899 Other long term (current) drug therapy: Secondary | ICD-10-CM | POA: Insufficient documentation

## 2016-01-02 DIAGNOSIS — R1031 Right lower quadrant pain: Secondary | ICD-10-CM | POA: Diagnosis present

## 2016-01-02 HISTORY — DX: Reserved for inherently not codable concepts without codable children: IMO0001

## 2016-01-02 HISTORY — DX: Anemia, unspecified: D64.9

## 2016-01-02 HISTORY — DX: Gastro-esophageal reflux disease without esophagitis: K21.9

## 2016-01-02 HISTORY — DX: Unspecified osteoarthritis, unspecified site: M19.90

## 2016-01-02 HISTORY — DX: Unspecified asthma, uncomplicated: J45.909

## 2016-01-02 HISTORY — DX: Type 2 diabetes mellitus without complications: E11.9

## 2016-01-02 NOTE — ED Notes (Signed)
Pt c/o abd pain and bloating onset this evening.  Pt states he last ate some chinese food approx 1630, denies vomiting or diarrhea

## 2016-01-03 ENCOUNTER — Observation Stay (HOSPITAL_COMMUNITY): Payer: BLUE CROSS/BLUE SHIELD | Admitting: Anesthesiology

## 2016-01-03 ENCOUNTER — Emergency Department (HOSPITAL_COMMUNITY): Payer: BLUE CROSS/BLUE SHIELD

## 2016-01-03 ENCOUNTER — Encounter (HOSPITAL_COMMUNITY): Payer: Self-pay | Admitting: *Deleted

## 2016-01-03 ENCOUNTER — Encounter (HOSPITAL_COMMUNITY)
Admission: EM | Disposition: A | Discharge status: Home / Self Care | Payer: Self-pay | Source: Home / Self Care | Attending: Emergency Medicine

## 2016-01-03 DIAGNOSIS — K3580 Unspecified acute appendicitis: Secondary | ICD-10-CM | POA: Diagnosis present

## 2016-01-03 HISTORY — PX: LAPAROSCOPIC APPENDECTOMY: SHX408

## 2016-01-03 LAB — COMPREHENSIVE METABOLIC PANEL
ALK PHOS: 73 U/L (ref 38–126)
ALT: 83 U/L — AB (ref 17–63)
AST: 41 U/L (ref 15–41)
Albumin: 4.8 g/dL (ref 3.5–5.0)
Anion gap: 8 (ref 5–15)
BUN: 21 mg/dL — ABNORMAL HIGH (ref 6–20)
CALCIUM: 9.3 mg/dL (ref 8.9–10.3)
CHLORIDE: 102 mmol/L (ref 101–111)
CO2: 27 mmol/L (ref 22–32)
Creatinine, Ser: 1.24 mg/dL (ref 0.61–1.24)
GFR calc Af Amer: >60 mL/min (ref >60)
GLUCOSE: 141 mg/dL — AB (ref 65–99)
Potassium: 3.8 mmol/L (ref 3.5–5.1)
Sodium: 137 mmol/L (ref 135–145)
TOTAL PROTEIN: 8.3 g/dL — AB (ref 6.5–8.1)
Total Bilirubin: 0.6 mg/dL (ref 0.3–1.2)

## 2016-01-03 LAB — URINALYSIS, ROUTINE W REFLEX MICROSCOPIC
BILIRUBIN URINE: NEGATIVE
Glucose, UA: NEGATIVE mg/dL
HGB URINE DIPSTICK: NEGATIVE
KETONES UR: NEGATIVE mg/dL
Leukocytes, UA: NEGATIVE
NITRITE: NEGATIVE
PH: 7 (ref 5.0–8.0)
Protein, ur: NEGATIVE mg/dL
Specific Gravity, Urine: 1.01 (ref 1.005–1.030)

## 2016-01-03 LAB — GLUCOSE, CAPILLARY
GLUCOSE-CAPILLARY: 105 mg/dL — AB (ref 65–99)
Glucose-Capillary: 126 mg/dL — ABNORMAL HIGH (ref 65–99)

## 2016-01-03 LAB — CBC WITH DIFFERENTIAL/PLATELET
BASOS ABS: 0 10*3/uL (ref 0.0–0.1)
Basophils Relative: 0 %
EOS PCT: 2 %
Eosinophils Absolute: 0.4 10*3/uL (ref 0.0–0.7)
HCT: 49.3 % (ref 39.0–52.0)
HEMOGLOBIN: 17.5 g/dL — AB (ref 13.0–17.0)
LYMPHS PCT: 6 %
Lymphs Abs: 1.1 10*3/uL (ref 0.7–4.0)
MCH: 33.1 pg (ref 26.0–34.0)
MCHC: 35.5 g/dL (ref 30.0–36.0)
MCV: 93.4 fL (ref 78.0–100.0)
Monocytes Absolute: 0.9 10*3/uL (ref 0.1–1.0)
Monocytes Relative: 5 %
NEUTROS ABS: 16 10*3/uL — AB (ref 1.7–7.7)
NEUTROS PCT: 87 %
PLATELETS: 269 10*3/uL (ref 150–400)
RBC: 5.28 MIL/uL (ref 4.22–5.81)
RDW: 12.5 % (ref 11.5–15.5)
WBC: 18.3 10*3/uL — AB (ref 4.0–10.5)

## 2016-01-03 LAB — LIPASE, BLOOD: Lipase: 34 U/L (ref 11–51)

## 2016-01-03 SURGERY — APPENDECTOMY, LAPAROSCOPIC
Anesthesia: General

## 2016-01-03 MED ORDER — HYDROMORPHONE HCL 1 MG/ML IJ SOLN: 1.0000 mg | INTRAMUSCULAR | Status: DC | PRN

## 2016-01-03 MED ORDER — ARTIFICIAL TEARS OP OINT
TOPICAL_OINTMENT | OPHTHALMIC | Status: AC
  Filled 2016-01-03: qty 3.5

## 2016-01-03 MED ORDER — ROCURONIUM 10MG/ML (10ML) SYRINGE FOR MEDFUSION PUMP - OPTIME
INTRAVENOUS | Status: DC | PRN
  Administered 2016-01-03: 10 mg via INTRAVENOUS

## 2016-01-03 MED ORDER — POVIDONE-IODINE 10 % EX OINT
TOPICAL_OINTMENT | CUTANEOUS | Status: AC
Start: 2016-01-03 — End: 2016-01-03
  Filled 2016-01-03: qty 1

## 2016-01-03 MED ORDER — MORPHINE SULFATE (PF) 4 MG/ML IV SOLN
4.0000 mg | INTRAVENOUS | Status: DC | PRN
  Administered 2016-01-03: 4 mg via INTRAVENOUS
  Filled 2016-01-03: qty 1

## 2016-01-03 MED ORDER — ONDANSETRON HCL 4 MG/2ML IJ SOLN
4.0000 mg | Freq: Once | INTRAMUSCULAR | Status: AC
  Administered 2016-01-03: 4 mg via INTRAVENOUS

## 2016-01-03 MED ORDER — PIPERACILLIN-TAZOBACTAM 3.375 G IVPB
3.3750 g | INTRAVENOUS | Status: AC
  Administered 2016-01-03: 3.375 g via INTRAVENOUS
  Filled 2016-01-03: qty 50

## 2016-01-03 MED ORDER — MIDAZOLAM HCL 2 MG/2ML IJ SOLN
INTRAMUSCULAR | Status: AC
  Filled 2016-01-03: qty 2

## 2016-01-03 MED ORDER — LIDOCAINE HCL (CARDIAC) 20 MG/ML IV SOLN
INTRAVENOUS | Status: DC | PRN
  Administered 2016-01-03: 50 mg via INTRAVENOUS

## 2016-01-03 MED ORDER — NEOSTIGMINE METHYLSULFATE 10 MG/10ML IV SOLN
INTRAVENOUS | Status: DC | PRN
  Administered 2016-01-03: 4 mg via INTRAVENOUS

## 2016-01-03 MED ORDER — ALBUTEROL SULFATE (2.5 MG/3ML) 0.083% IN NEBU
2.5000 mg | INHALATION_SOLUTION | Freq: Once | RESPIRATORY_TRACT | Status: AC
  Administered 2016-01-03: 2.5 mg via RESPIRATORY_TRACT

## 2016-01-03 MED ORDER — SODIUM CHLORIDE 0.9 % IV SOLN: 3.0000 g | Freq: Once | INTRAVENOUS | Status: DC

## 2016-01-03 MED ORDER — ARTIFICIAL TEARS OP OINT
TOPICAL_OINTMENT | OPHTHALMIC | Status: DC | PRN
  Administered 2016-01-03: 1 via OPHTHALMIC

## 2016-01-03 MED ORDER — ROCURONIUM BROMIDE 50 MG/5ML IV SOLN
INTRAVENOUS | Status: AC
Start: 2016-01-03 — End: 2016-01-03
  Filled 2016-01-03: qty 1

## 2016-01-03 MED ORDER — ALBUTEROL SULFATE (2.5 MG/3ML) 0.083% IN NEBU
INHALATION_SOLUTION | RESPIRATORY_TRACT | Status: AC
  Filled 2016-01-03: qty 3

## 2016-01-03 MED ORDER — ONDANSETRON HCL 4 MG/2ML IJ SOLN
INTRAMUSCULAR | Status: AC
  Filled 2016-01-03: qty 2

## 2016-01-03 MED ORDER — LACTATED RINGERS IV SOLN
INTRAVENOUS | Status: DC
  Administered 2016-01-03 (×2): via INTRAVENOUS

## 2016-01-03 MED ORDER — NEOSTIGMINE METHYLSULFATE 10 MG/10ML IV SOLN
INTRAVENOUS | Status: AC
  Filled 2016-01-03: qty 1

## 2016-01-03 MED ORDER — BUPIVACAINE HCL (PF) 0.5 % IJ SOLN
INTRAMUSCULAR | Status: DC | PRN
  Administered 2016-01-03: 10 mL

## 2016-01-03 MED ORDER — KETOROLAC TROMETHAMINE 30 MG/ML IJ SOLN
INTRAMUSCULAR | Status: AC
  Filled 2016-01-03: qty 1

## 2016-01-03 MED ORDER — ACETAMINOPHEN 325 MG PO TABS: 650.0000 mg | ORAL_TABLET | Freq: Four times a day (QID) | ORAL | Status: DC | PRN

## 2016-01-03 MED ORDER — IOHEXOL 300 MG/ML  SOLN
100.0000 mL | Freq: Once | INTRAMUSCULAR | Status: AC | PRN
  Administered 2016-01-03: 100 mL via INTRAVENOUS

## 2016-01-03 MED ORDER — MIDAZOLAM HCL 2 MG/2ML IJ SOLN
1.0000 mg | INTRAMUSCULAR | Status: DC | PRN
  Administered 2016-01-03 (×2): 2 mg via INTRAVENOUS
  Filled 2016-01-03: qty 2

## 2016-01-03 MED ORDER — SODIUM CHLORIDE 0.9 % IV SOLN
INTRAVENOUS | Status: DC
  Administered 2016-01-03: 04:00:00 via INTRAVENOUS

## 2016-01-03 MED ORDER — BUPIVACAINE HCL (PF) 0.5 % IJ SOLN
INTRAMUSCULAR | Status: AC
  Filled 2016-01-03: qty 30

## 2016-01-03 MED ORDER — IPRATROPIUM-ALBUTEROL 0.5-2.5 (3) MG/3ML IN SOLN
RESPIRATORY_TRACT | Status: AC
  Filled 2016-01-03: qty 3

## 2016-01-03 MED ORDER — SODIUM CHLORIDE 0.9 % IR SOLN
Status: DC | PRN
  Administered 2016-01-03: 1000 mL

## 2016-01-03 MED ORDER — ONDANSETRON HCL 4 MG/2ML IJ SOLN: 4.0000 mg | Freq: Four times a day (QID) | INTRAMUSCULAR | Status: DC | PRN

## 2016-01-03 MED ORDER — ALBUTEROL SULFATE (2.5 MG/3ML) 0.083% IN NEBU: 2.5000 mg | INHALATION_SOLUTION | Freq: Four times a day (QID) | RESPIRATORY_TRACT | Status: DC | PRN

## 2016-01-03 MED ORDER — SODIUM CHLORIDE 0.9 % IJ SOLN
INTRAMUSCULAR | Status: AC
  Filled 2016-01-03: qty 10

## 2016-01-03 MED ORDER — FENTANYL CITRATE (PF) 100 MCG/2ML IJ SOLN
INTRAMUSCULAR | Status: AC
Start: 2016-01-03 — End: 2016-01-03
  Filled 2016-01-03: qty 2

## 2016-01-03 MED ORDER — PIPERACILLIN-TAZOBACTAM 3.375 G IVPB
3.3750 g | Freq: Three times a day (TID) | INTRAVENOUS | Status: DC
  Filled 2016-01-03 (×4): qty 50

## 2016-01-03 MED ORDER — SUCCINYLCHOLINE CHLORIDE 20 MG/ML IJ SOLN
INTRAMUSCULAR | Status: DC | PRN
  Administered 2016-01-03: 140 mg via INTRAVENOUS

## 2016-01-03 MED ORDER — SUCCINYLCHOLINE CHLORIDE 20 MG/ML IJ SOLN
INTRAMUSCULAR | Status: AC
  Filled 2016-01-03: qty 1

## 2016-01-03 MED ORDER — ONDANSETRON 4 MG PO TBDP: 4.0000 mg | ORAL_TABLET | Freq: Four times a day (QID) | ORAL | Status: DC | PRN

## 2016-01-03 MED ORDER — DIPHENHYDRAMINE HCL 50 MG/ML IJ SOLN: 25.0000 mg | Freq: Four times a day (QID) | INTRAMUSCULAR | Status: DC | PRN

## 2016-01-03 MED ORDER — ALBUTEROL SULFATE HFA 108 (90 BASE) MCG/ACT IN AERS: 2.0000 | INHALATION_SPRAY | Freq: Four times a day (QID) | RESPIRATORY_TRACT | Status: DC | PRN

## 2016-01-03 MED ORDER — EPHEDRINE SULFATE 50 MG/ML IJ SOLN
INTRAMUSCULAR | Status: AC
  Filled 2016-01-03: qty 1

## 2016-01-03 MED ORDER — ONDANSETRON HCL 4 MG/2ML IJ SOLN: 4.0000 mg | Freq: Once | INTRAMUSCULAR | Status: DC | PRN

## 2016-01-03 MED ORDER — MORPHINE SULFATE (PF) 4 MG/ML IV SOLN
4.0000 mg | Freq: Once | INTRAVENOUS | Status: AC
  Administered 2016-01-03: 4 mg via INTRAVENOUS
  Filled 2016-01-03: qty 1

## 2016-01-03 MED ORDER — ROCURONIUM BROMIDE 100 MG/10ML IV SOLN
INTRAVENOUS | Status: DC | PRN
  Administered 2016-01-03 (×2): 10 mg via INTRAVENOUS

## 2016-01-03 MED ORDER — FENTANYL CITRATE (PF) 100 MCG/2ML IJ SOLN: 25.0000 ug | INTRAMUSCULAR | Status: DC | PRN

## 2016-01-03 MED ORDER — FENTANYL CITRATE (PF) 100 MCG/2ML IJ SOLN
INTRAMUSCULAR | Status: DC | PRN
  Administered 2016-01-03 (×5): 50 ug via INTRAVENOUS

## 2016-01-03 MED ORDER — PIPERACILLIN-TAZOBACTAM 3.375 G IVPB 30 MIN
3.3750 g | Freq: Three times a day (TID) | INTRAVENOUS | Status: DC
  Administered 2016-01-03: 3.375 g via INTRAVENOUS
  Filled 2016-01-03: qty 50

## 2016-01-03 MED ORDER — PROPOFOL 10 MG/ML IV BOLUS
INTRAVENOUS | Status: DC | PRN
  Administered 2016-01-03: 60 mg via INTRAVENOUS
  Administered 2016-01-03: 140 mg via INTRAVENOUS

## 2016-01-03 MED ORDER — LIDOCAINE HCL (PF) 1 % IJ SOLN
INTRAMUSCULAR | Status: AC
  Filled 2016-01-03: qty 5

## 2016-01-03 MED ORDER — FENTANYL CITRATE (PF) 100 MCG/2ML IJ SOLN
25.0000 ug | INTRAMUSCULAR | Status: AC
  Administered 2016-01-03 (×2): 25 ug via INTRAVENOUS

## 2016-01-03 MED ORDER — LACTATED RINGERS IV SOLN
INTRAVENOUS | Status: DC
  Administered 2016-01-03 (×2): via INTRAVENOUS

## 2016-01-03 MED ORDER — ONDANSETRON HCL 4 MG/2ML IJ SOLN
4.0000 mg | Freq: Once | INTRAMUSCULAR | Status: AC
  Administered 2016-01-03: 4 mg via INTRAVENOUS
  Filled 2016-01-03: qty 2

## 2016-01-03 MED ORDER — PROPOFOL 10 MG/ML IV BOLUS
INTRAVENOUS | Status: AC
  Filled 2016-01-03: qty 20

## 2016-01-03 MED ORDER — GLYCOPYRROLATE 0.2 MG/ML IJ SOLN
INTRAMUSCULAR | Status: DC | PRN
  Administered 2016-01-03: 0.6 mg via INTRAVENOUS

## 2016-01-03 MED ORDER — FENTANYL CITRATE (PF) 250 MCG/5ML IJ SOLN
INTRAMUSCULAR | Status: AC
  Filled 2016-01-03: qty 5

## 2016-01-03 MED ORDER — PIPERACILLIN-TAZOBACTAM 3.375 G IVPB
3.3750 g | Freq: Three times a day (TID) | INTRAVENOUS | Status: DC
  Administered 2016-01-03 – 2016-01-04 (×2): 3.375 g via INTRAVENOUS
  Filled 2016-01-03 (×2): qty 50

## 2016-01-03 MED ORDER — HYDROMORPHONE HCL 1 MG/ML IJ SOLN
1.0000 mg | Freq: Once | INTRAMUSCULAR | Status: AC
  Administered 2016-01-03: 1 mg via INTRAVENOUS
  Filled 2016-01-03: qty 1

## 2016-01-03 MED ORDER — OXYCODONE-ACETAMINOPHEN 5-325 MG PO TABS: 1.0000 | ORAL_TABLET | ORAL | Status: DC | PRN

## 2016-01-03 MED ORDER — DIPHENHYDRAMINE HCL 25 MG PO CAPS: 25.0000 mg | ORAL_CAPSULE | Freq: Four times a day (QID) | ORAL | Status: DC | PRN

## 2016-01-03 MED ORDER — KETOROLAC TROMETHAMINE 30 MG/ML IJ SOLN
30.0000 mg | Freq: Once | INTRAMUSCULAR | Status: AC
  Administered 2016-01-03: 30 mg via INTRAVENOUS

## 2016-01-03 MED ORDER — LORAZEPAM 2 MG/ML IJ SOLN: 1.0000 mg | INTRAMUSCULAR | Status: DC | PRN

## 2016-01-03 MED ORDER — ACETAMINOPHEN 650 MG RE SUPP: 650.0000 mg | Freq: Four times a day (QID) | RECTAL | Status: DC | PRN

## 2016-01-03 MED ORDER — SIMETHICONE 80 MG PO CHEW: 40.0000 mg | CHEWABLE_TABLET | Freq: Four times a day (QID) | ORAL | Status: DC | PRN

## 2016-01-03 MED ORDER — GLYCOPYRROLATE 0.2 MG/ML IJ SOLN
INTRAMUSCULAR | Status: AC
  Filled 2016-01-03: qty 3

## 2016-01-03 MED ORDER — POVIDONE-IODINE 10 % OINT PACKET
TOPICAL_OINTMENT | CUTANEOUS | Status: DC | PRN
  Administered 2016-01-03: 1 via TOPICAL

## 2016-01-03 SURGICAL SUPPLY — 41 items
BAG HAMPER (MISCELLANEOUS) ×3 IMPLANT
BAG SPEC RTRVL LRG 6X4 10 (ENDOMECHANICALS) ×1
CHLORAPREP W/TINT 26ML (MISCELLANEOUS) ×3 IMPLANT
CLOTH BEACON ORANGE TIMEOUT ST (SAFETY) ×3 IMPLANT
COVER LIGHT HANDLE STERIS (MISCELLANEOUS) ×6 IMPLANT
CUTTER FLEX LINEAR 45M (STAPLE) ×2 IMPLANT
DECANTER SPIKE VIAL GLASS SM (MISCELLANEOUS) ×3 IMPLANT
ELECT REM PT RETURN 9FT ADLT (ELECTROSURGICAL) ×3
ELECTRODE REM PT RTRN 9FT ADLT (ELECTROSURGICAL) ×1 IMPLANT
EVACUATOR SMOKE 8.L (FILTER) ×3 IMPLANT
FORMALIN 10 PREFIL 120ML (MISCELLANEOUS) ×3 IMPLANT
GLOVE BIO SURGEON STRL SZ7 (GLOVE) ×2 IMPLANT
GLOVE BIOGEL PI IND STRL 7.0 (GLOVE) ×1 IMPLANT
GLOVE BIOGEL PI INDICATOR 7.0 (GLOVE) ×8
GLOVE SURG SS PI 7.5 STRL IVOR (GLOVE) ×3 IMPLANT
GOWN STRL REUS W/ TWL XL LVL3 (GOWN DISPOSABLE) ×1 IMPLANT
GOWN STRL REUS W/TWL LRG LVL3 (GOWN DISPOSABLE) ×3 IMPLANT
GOWN STRL REUS W/TWL XL LVL3 (GOWN DISPOSABLE) ×3
INST SET LAPROSCOPIC AP (KITS) ×3 IMPLANT
KIT ROOM TURNOVER APOR (KITS) ×3 IMPLANT
MANIFOLD NEPTUNE II (INSTRUMENTS) ×3 IMPLANT
NDL INSUFFLATION 14GA 120MM (NEEDLE) ×1 IMPLANT
NEEDLE INSUFFLATION 14GA 120MM (NEEDLE) ×3 IMPLANT
NS IRRIG 1000ML POUR BTL (IV SOLUTION) ×3 IMPLANT
PACK LAP CHOLE LZT030E (CUSTOM PROCEDURE TRAY) ×3 IMPLANT
PAD ARMBOARD 7.5X6 YLW CONV (MISCELLANEOUS) ×3 IMPLANT
POUCH SPECIMEN RETRIEVAL 10MM (ENDOMECHANICALS) ×3 IMPLANT
SET BASIN LINEN APH (SET/KITS/TRAYS/PACK) ×3 IMPLANT
SHEARS HARMONIC ACE PLUS 36CM (ENDOMECHANICALS) ×3 IMPLANT
SPONGE GAUZE 2X2 8PLY STER LF (GAUZE/BANDAGES/DRESSINGS) ×3
SPONGE GAUZE 2X2 8PLY STRL LF (GAUZE/BANDAGES/DRESSINGS) ×6 IMPLANT
STAPLER VISISTAT (STAPLE) ×3 IMPLANT
SUT VICRYL 0 UR6 27IN ABS (SUTURE) ×3 IMPLANT
TAPE CLOTH SURG 4X10 WHT LF (GAUZE/BANDAGES/DRESSINGS) ×2 IMPLANT
TRAY FOLEY CATH SILVER 16FR (SET/KITS/TRAYS/PACK) ×3 IMPLANT
TROCAR ENDO BLADELESS 11MM (ENDOMECHANICALS) ×3 IMPLANT
TROCAR ENDO BLADELESS 12MM (ENDOMECHANICALS) ×3 IMPLANT
TROCAR XCEL NON-BLD 5MMX100MML (ENDOMECHANICALS) ×3 IMPLANT
TUBING INSUFFLATION (TUBING) ×3 IMPLANT
WARMER LAPAROSCOPE (MISCELLANEOUS) ×3 IMPLANT
YANKAUER SUCT 12FT TUBE ARGYLE (SUCTIONS) ×3 IMPLANT

## 2016-01-03 NOTE — Op Note (Signed)
Patient:  Dakota Anthony.  DOB:  05/17/1966  MRN:  [id number]   Preop Diagnosis:  Acute appendicitis  Postop Diagnosis:  Same  Procedure:  Laparoscopic appendectomy  Surgeon:  Aviva Signs, M.D.  Anes:  Gen. endotracheal  Indications:  Patient is a 50 year old white male who presents with a less than 24-hour history of worsening right lower quadrant abdominal pain. CT scan of the abdomen reveals acute appendicitis. The risks and benefits of the procedure including bleeding, infection, and the possibility of an open procedure were fully explained to the patient, who gave informed consent.  Procedure note:  The patient was placed the supine position. After induction of general endotracheal anesthesia, the abdomen was prepped and draped using usual sterile technique with DuraPrep. Surgical site confirmation was performed.  A supraumbilical incision was made down to the fascia. A Veress needle was introduced into the abdominal cavity and confirmation of placement was done using the saline drop test. The abdomen was then insufflated to 15 mmHg pressure. An 11 mm trocar was introduced into the abdominal cavity under direct visualization without difficulty. The patient was placed in deeper Trendelenburg position and additional 12 mm trocar was placed in suprapubic region and a 5 mm trocar was placed in left lower quadrant region. The appendix was visualized and noted to be somewhat retrocecal in nature. The mesoappendix was divided using the harmonic scalpel. There was no evidence of perforation. A vascular Endo GIA was placed across the base the appendix and fired. The appendix was then removed using an Endo Catch bag without difficulty. The staple line was inspected and noted within normal limits. All fluid and air were then evacuated from the abdominal cavity prior to removal of the trochars.  All wounds were irrigated with normal saline. All wounds were injected with 0.5% Sensorcaine. The  supraumbilical fascia was reapproximated using 0 Vicryl interrupted suture. All skin incisions were closed using staples. Betadine ointment and dry sterile dressings were applied.  All tape and needle counts were correct at the end of the procedure. Patient was extubated in the operating room and transferred to PACU in stable condition.  Complications:  None  EBL:  Minimal  Specimen:  Appendix

## 2016-01-03 NOTE — Transfer of Care (Signed)
Immediate Anesthesia Transfer of Care Note  Patient: Dakota Anthony.  Procedure(s) Performed: Procedure(s): APPENDECTOMY LAPAROSCOPIC (N/A)  Patient Location: PACU  Anesthesia Type:General  Level of Consciousness: sedated and patient cooperative  Airway & Oxygen Therapy: Patient Spontanous Breathing and Patient connected to face mask oxygen  Post-op Assessment: Report given to RN and Post -op Vital signs reviewed and stable  Post vital signs: Reviewed and stable  Last Vitals:  Filed Vitals:   01/03/16 1215 01/03/16 1340  BP: 134/82 137/84  Pulse:  96  Temp:    Resp: 16 15    Complications: No apparent anesthesia complications

## 2016-01-03 NOTE — H&P (Signed)
Dakota Anthony. is an 50 y.o. male.   Chief Complaint: Right lower quadrant abdominal pain HPI: Patient is a 50 year old white male who presents to the emergency room with a history of worsening right lower quadrant abdominal pain which started yesterday evening. CT scan of the abdomen reveals acute appendicitis.  Past Medical History  Diagnosis Date  . Chest discomfort   . Migraine headache   . Anxiety     Possible bipolar disorder  . Left eye injury     Traumatic damage  . Chronic back pain     Past Surgical History  Procedure Laterality Date  . Cataract extraction      From left eye following injury as a teenager  . Cholecystectomy  10/31/2011    Procedure: LAPAROSCOPIC CHOLECYSTECTOMY;  Surgeon: Jamesetta So;  Location: [facility];  Service: General;  Laterality: N/A;    Family History  Problem Relation Age of Onset  . Kidney failure Mother   . Hypertension Mother   . Diabetes Mother   . Cancer Father   . Cirrhosis Father     Hepatic  . Anesthesia problems Neg Hx   . Hypotension Neg Hx   . Malignant hyperthermia Neg Hx   . Pseudochol deficiency Neg Hx    Social History:  reports that he has never smoked. He uses smokeless tobacco. He reports that he does not drink alcohol or use illicit drugs.  Allergies: No Known Allergies  Medications Prior to Admission  Medication Sig Dispense Refill  . albuterol (PROVENTIL HFA;VENTOLIN HFA) 108 (90 BASE) MCG/ACT inhaler Inhale 2 puffs into the lungs every 6 (six) hours as needed for wheezing or shortness of breath. 1 Inhaler 5  . ALPRAZolam (XANAX) 1 MG tablet TAKE ONE TABLET BY MOUTH FOUR TIMES DAILY. 120 tablet 0  . aspirin 81 MG tablet Take 81 mg by mouth daily.      . diclofenac (VOLTAREN) 75 MG EC tablet Take 1 tablet (75 mg total) by mouth 2 (two) times daily. 28 tablet 1    Results for orders placed or performed during the hospital encounter of 01/02/16 (from the past 48 hour(s))  CBC with Differential     Status:  Abnormal   Collection Time: 01/03/16 12:48 AM  Result Value Ref Range   WBC 18.3 (H) 4.0 - 10.5 K/uL   RBC 5.28 4.22 - 5.81 MIL/uL   Hemoglobin 17.5 (H) 13.0 - 17.0 g/dL   HCT 49.3 39.0 - 52.0 %   MCV 93.4 78.0 - 100.0 fL   MCH 33.1 26.0 - 34.0 pg   MCHC 35.5 30.0 - 36.0 g/dL   RDW 12.5 11.5 - 15.5 %   Platelets 269 150 - 400 K/uL   Neutrophils Relative % 87 %   Neutro Abs 16.0 (H) 1.7 - 7.7 K/uL   Lymphocytes Relative 6 %   Lymphs Abs 1.1 0.7 - 4.0 K/uL   Monocytes Relative 5 %   Monocytes Absolute 0.9 0.1 - 1.0 K/uL   Eosinophils Relative 2 %   Eosinophils Absolute 0.4 0.0 - 0.7 K/uL   Basophils Relative 0 %   Basophils Absolute 0.0 0.0 - 0.1 K/uL  Comprehensive metabolic panel     Status: Abnormal   Collection Time: 01/03/16 12:48 AM  Result Value Ref Range   Sodium 137 135 - 145 mmol/L   Potassium 3.8 3.5 - 5.1 mmol/L   Chloride 102 101 - 111 mmol/L   CO2 27 22 - 32 mmol/L   Glucose,  Bld 141 (H) 65 - 99 mg/dL   BUN 21 (H) 6 - 20 mg/dL   Creatinine, Ser 1.24 0.61 - 1.24 mg/dL   Calcium 9.3 8.9 - 10.3 mg/dL   Total Protein 8.3 (H) 6.5 - 8.1 g/dL   Albumin 4.8 3.5 - 5.0 g/dL   AST 41 15 - 41 U/L   ALT 83 (H) 17 - 63 U/L   Alkaline Phosphatase 73 38 - 126 U/L   Total Bilirubin 0.6 0.3 - 1.2 mg/dL   GFR calc non Af Amer >60 >60 mL/min   GFR calc Af Amer >60 >60 mL/min    Comment: (NOTE) The eGFR has been calculated using the CKD EPI equation. This calculation has not been validated in all clinical situations. eGFR's persistently <60 mL/min signify possible Chronic Kidney Disease.    Anion gap 8 5 - 15  Lipase, blood     Status: None   Collection Time: 01/03/16 12:48 AM  Result Value Ref Range   Lipase 34 11 - 51 U/L  Urinalysis, Routine w reflex microscopic (not at [facility])     Status: None   Collection Time: 01/03/16  6:45 AM  Result Value Ref Range   Color, Urine YELLOW YELLOW   APPearance CLEAR CLEAR   Specific Gravity, Urine 1.010 1.005 - 1.030   pH 7.0 5.0 -  8.0   Glucose, UA NEGATIVE NEGATIVE mg/dL   Hgb urine dipstick NEGATIVE NEGATIVE   Bilirubin Urine NEGATIVE NEGATIVE   Ketones, ur NEGATIVE NEGATIVE mg/dL   Protein, ur NEGATIVE NEGATIVE mg/dL   Nitrite NEGATIVE NEGATIVE   Leukocytes, UA NEGATIVE NEGATIVE    Comment: MICROSCOPIC NOT DONE ON URINES WITH NEGATIVE PROTEIN, BLOOD, LEUKOCYTES, NITRITE, OR GLUCOSE <1000 mg/dL.   Ct Abdomen Pelvis W Contrast  01/03/2016  CLINICAL DATA:  Abdominal pain and distension. EXAM: CT ABDOMEN AND PELVIS WITH CONTRAST TECHNIQUE: Multidetector CT imaging of the abdomen and pelvis was performed using the standard protocol following bolus administration of intravenous contrast. CONTRAST:  123m OMNIPAQUE IOHEXOL 300 MG/ML  SOLN COMPARISON:  None. FINDINGS: Lower chest and abdominal wall:  No contributory findings. Hepatobiliary: Marked hepatic steatosis. Probable 1 cm cyst in the upper right liver.Cholecystectomy with normal common bile duct. Pancreas: Unremarkable. Spleen: Unremarkable. Adrenals/Urinary Tract: Negative adrenals. No hydronephrosis or stone. Unremarkable bladder. Reproductive:No pathologic findings. Stomach/Bowel: The appendix is dilated and thick walled with mesoappendix inflammation. At its greatest diameter, the appendix measures 16 mm. No abscess or perforation seen. The appendix extends medially and posteriorly from the cecum. No bowel obstruction. Vascular/Lymphatic: No acute vascular abnormality. No mass or adenopathy. Peritoneal: No ascites or pneumoperitoneum. Musculoskeletal: Negative IMPRESSION: 1. Acute suppurative appendicitis without perforation. 2. Hepatic steatosis. Electronically Signed   By: JMonte FantasiaM.D.   On: 01/03/2016 02:24   Dg Abd Acute W/chest  01/03/2016  CLINICAL DATA:  Acute onset of generalized abdominal distention and lower abdominal pain. Initial encounter. EXAM: DG ABDOMEN ACUTE W/ 1V CHEST COMPARISON:  Lumbar spine radiographs performed 01/23/2014 FINDINGS: The lungs  are mildly hypoexpanded. Peribronchial thickening is noted, with minimal left basilar atelectasis. There is no evidence of pleural effusion or pneumothorax. The cardiomediastinal silhouette is within normal limits. The visualized bowel gas pattern is unremarkable. Scattered stool and air are seen within the colon; there is no evidence of small bowel dilatation to suggest obstruction. No free intra-abdominal air is identified on the provided upright view. Clips are noted within the right upper quadrant, reflecting prior cholecystectomy. No acute osseous abnormalities are  seen; the sacroiliac joints are unremarkable in appearance. IMPRESSION: 1. Unremarkable bowel gas pattern; no free intra-abdominal air seen. Small to moderate amount of stool noted in the colon. 2. Lungs mildly hypoexpanded. Peribronchial thickening, with minimal left basilar atelectasis. Electronically Signed   By: Garald Balding M.D.   On: 01/03/2016 01:28    Review of Systems  Constitutional: Positive for malaise/fatigue.  HENT: Negative.   Eyes: Negative.   Respiratory: Negative.   Cardiovascular: Negative.   Gastrointestinal: Positive for nausea and abdominal pain.  Genitourinary: Negative.   Musculoskeletal: Negative.   Skin: Negative.     Blood pressure 129/89, pulse 101, temperature 98.5 F (36.9 C), temperature source Oral, resp. rate 14, height 6' 1"  (1.854 m), weight 95.255 kg (210 lb), SpO2 91 %. Physical Exam  Constitutional: He is oriented to person, place, and time. He appears well-developed and well-nourished.  HENT:  Head: Normocephalic and atraumatic.  Neck: Normal range of motion. Neck supple.  Cardiovascular: Normal rate, regular rhythm and normal heart sounds.   Respiratory: Effort normal.  GI: Soft. He exhibits no distension. There is tenderness.  No rigidity noted.  Neurological: He is alert and oriented to person, place, and time.  Skin: Skin is warm and dry.     Assessment/Plan Impression: Acute  appendicitis Plan: Patient will be taken to the operating room for laparoscopic appendectomy. The risks and benefits of the procedure including bleeding, infection, and the possibility of an open procedure were fully explained to the patient, who gave informed consent.  Jamesetta So, MD 01/03/2016, 9:00 AM

## 2016-01-03 NOTE — Anesthesia Procedure Notes (Signed)
Procedure Name: Intubation Date/Time: 01/03/2016 12:27 PM Performed by: Andree Elk, AMY A Pre-anesthesia Checklist: Patient identified, Patient being monitored, Timeout performed, Emergency Drugs available and Suction available Patient Re-evaluated:Patient Re-evaluated prior to inductionOxygen Delivery Method: Circle System Utilized Preoxygenation: Pre-oxygenation with 100% oxygen Intubation Type: IV induction, Rapid sequence and Cricoid Pressure applied Laryngoscope Size: 3 and Miller Grade View: Grade I Tube type: Oral Tube size: 7.0 mm Number of attempts: 1 Airway Equipment and Method: Stylet Placement Confirmation: ETT inserted through vocal cords under direct vision,  positive ETCO2 and breath sounds checked- equal and bilateral Secured at: 21 cm Tube secured with: Tape Dental Injury: Teeth and Oropharynx as per pre-operative assessment

## 2016-01-03 NOTE — ED Notes (Signed)
Surgery consent signed, patient to be transported to Pre op area. Family taking patient belongings with them.

## 2016-01-03 NOTE — Anesthesia Postprocedure Evaluation (Signed)
Anesthesia Post Note  Patient: Dakota Anthony.  Procedure(s) Performed: Procedure(s) (LRB): APPENDECTOMY LAPAROSCOPIC (N/A)  Patient location during evaluation: PACU Anesthesia Type: General Level of consciousness: awake and alert and oriented Pain management: pain level controlled Vital Signs Assessment: post-procedure vital signs reviewed and stable Respiratory status: spontaneous breathing and patient connected to nasal cannula oxygen Cardiovascular status: stable Postop Assessment: no signs of nausea or vomiting Anesthetic complications: no    Last Vitals:  Filed Vitals:   01/03/16 1400 01/03/16 1415  BP: 131/73   Pulse: 87 80  Temp:    Resp: 14 12    Last Pain:  Filed Vitals:   01/03/16 1416  PainSc: Asleep                 ADAMS, AMY A

## 2016-01-03 NOTE — ED Provider Notes (Signed)
CSN: [id number]     Arrival date & time 01/02/16  2257 History   First MD Initiated Contact with Patient 01/02/16 2343     Chief Complaint  Patient presents with  . Abdominal Pain     (Consider location/radiation/quality/duration/timing/severity/associated sxs/prior Treatment) Patient is a 50 y.o. male presenting with abdominal pain. The history is provided by the patient.  Abdominal Pain Pain location:  Generalized Pain quality: fullness, sharp and stabbing   Pain radiates to:  Does not radiate Pain severity:  Severe Onset quality:  Gradual Duration:  3 hours Timing:  Constant Progression:  Worsening Chronicity:  New Context: eating   Context comment:  Pt last ate chinese food 3 hours before start of sx Relieved by:  Nothing Worsened by:  Position changes and movement Ineffective treatments: tried to vomit for relief, unable.  Had a small normal bm, also without relief. Associated symptoms: nausea   Associated symptoms: no constipation, no diarrhea, no dysuria, no fever and no vomiting   Risk factors: no alcohol abuse   Risk factors comment:  Cholecystectomy   Past Medical History  Diagnosis Date  . Chest discomfort   . Migraine headache   . Anxiety     Possible bipolar disorder  . Left eye injury     Traumatic damage  . Chronic back pain    Past Surgical History  Procedure Laterality Date  . Cataract extraction      From left eye following injury as a teenager  . Cholecystectomy  10/31/2011    Procedure: LAPAROSCOPIC CHOLECYSTECTOMY;  Surgeon: Jamesetta So;  Location: [facility];  Service: General;  Laterality: N/A;   Family History  Problem Relation Age of Onset  . Kidney failure Mother   . Hypertension Mother   . Diabetes Mother   . Cancer Father   . Cirrhosis Father     Hepatic  . Anesthesia problems Neg Hx   . Hypotension Neg Hx   . Malignant hyperthermia Neg Hx   . Pseudochol deficiency Neg Hx    Social History  Substance Use Topics  . Smoking  status: Never Smoker   . Smokeless tobacco: Current User  . Alcohol Use: No     Comment: No excessive use    Review of Systems  Constitutional: Negative for fever.  HENT: Negative.   Gastrointestinal: Positive for nausea and abdominal pain. Negative for vomiting, diarrhea and constipation.  Genitourinary: Negative for dysuria.  Neurological: Negative.       Allergies  Review of patient's allergies indicates no known allergies.  Home Medications   Prior to Admission medications   Medication Sig Start Date End Date Taking? Authorizing Provider  albuterol (PROVENTIL HFA;VENTOLIN HFA) 108 (90 BASE) MCG/ACT inhaler Inhale 2 puffs into the lungs every 6 (six) hours as needed for wheezing or shortness of breath. 02/21/14   Mikey Kirschner, MD  ALPRAZolam Duanne Moron) 1 MG tablet TAKE ONE TABLET BY MOUTH FOUR TIMES DAILY. 07/27/14   Mikey Kirschner, MD  aspirin 81 MG tablet Take 81 mg by mouth daily.      Historical Provider, MD  diclofenac (VOLTAREN) 75 MG EC tablet Take 1 tablet (75 mg total) by mouth 2 (two) times daily. 05/10/13   Mikey Kirschner, MD   BP 138/93 mmHg  Pulse 81  Temp(Src) 97.8 F (36.6 C) (Oral)  Resp 16  Ht 6\' 1"  (1.854 m)  Wt 95.255 kg  BMI 27.71 kg/m2  SpO2 96% Physical Exam  Constitutional: He appears well-developed and  well-nourished.  HENT:  Head: Normocephalic and atraumatic.  Eyes: Conjunctivae are normal.  Neck: Normal range of motion.  Cardiovascular: Normal rate, regular rhythm, normal heart sounds and intact distal pulses.   Pulmonary/Chest: Effort normal and breath sounds normal. He has no wheezes.  Abdominal: Soft. He exhibits distension. He exhibits no ascites and no mass. Bowel sounds are decreased. There is no hepatosplenomegaly. There is tenderness. There is no guarding. No hernia.  No significant tympany to percussion.  Musculoskeletal: Normal range of motion.  Neurological: He is alert.  Skin: Skin is warm and dry.  Psychiatric: He has a  normal mood and affect.  Nursing note and vitals reviewed.   ED Course  Procedures (including critical care time)   Labs ordered, pt given morphine 4 mg, zofran 4 mg with pain 7/10 from 10/10.  Added dilaudid 1 mg IV.   Labs Review Labs Reviewed  CBC WITH DIFFERENTIAL/PLATELET - Abnormal; Notable for the following:    WBC 18.3 (*)    Hemoglobin 17.5 (*)    Neutro Abs 16.0 (*)    All other components within normal limits  COMPREHENSIVE METABOLIC PANEL - Abnormal; Notable for the following:    Glucose, Bld 141 (*)    BUN 21 (*)    Total Protein 8.3 (*)    ALT 83 (*)    All other components within normal limits  LIPASE, BLOOD  URINALYSIS, ROUTINE W REFLEX MICROSCOPIC (NOT AT [facility])    Imaging Review Dg Abd Acute W/chest  01/03/2016  CLINICAL DATA:  Acute onset of generalized abdominal distention and lower abdominal pain. Initial encounter. EXAM: DG ABDOMEN ACUTE W/ 1V CHEST COMPARISON:  Lumbar spine radiographs performed 01/23/2014 FINDINGS: The lungs are mildly hypoexpanded. Peribronchial thickening is noted, with minimal left basilar atelectasis. There is no evidence of pleural effusion or pneumothorax. The cardiomediastinal silhouette is within normal limits. The visualized bowel gas pattern is unremarkable. Scattered stool and air are seen within the colon; there is no evidence of small bowel dilatation to suggest obstruction. No free intra-abdominal air is identified on the provided upright view. Clips are noted within the right upper quadrant, reflecting prior cholecystectomy. No acute osseous abnormalities are seen; the sacroiliac joints are unremarkable in appearance. IMPRESSION: 1. Unremarkable bowel gas pattern; no free intra-abdominal air seen. Small to moderate amount of stool noted in the colon. 2. Lungs mildly hypoexpanded. Peribronchial thickening, with minimal left basilar atelectasis. Electronically Signed   By: Garald Balding M.D.   On: 01/03/2016 01:28   I have  personally reviewed and evaluated these images and lab results as part of my medical decision-making.   EKG Interpretation None      MDM   Final diagnoses:  None    Abdominal pain and distention without clear etiology,  Leukocytosis, no obstruction on plain films.  CT pending at this time.  Pt discussed with Dr Tomi Bamberger who will dispo pt once work up completed.    Evalee Jefferson, PA-C 01/03/16 [id number]  Rolland Porter, MD 01/03/16 [id number]

## 2016-01-03 NOTE — ED Notes (Signed)
Pt states he is unable to urinate at this time.

## 2016-01-03 NOTE — Anesthesia Preprocedure Evaluation (Addendum)
Anesthesia Evaluation  Patient identified by MRN, date of birth, ID band Patient awake    Reviewed: Allergy & Precautions, H&P , NPO status , Patient's Chart, lab work & pertinent test results  Airway Mallampati: I  TM Distance: >3 FB Neck ROM: Full    Dental no notable dental hx. (+) Teeth Intact   Pulmonary neg pulmonary ROS, shortness of breath, asthma ,    Pulmonary exam normal        Cardiovascular negative cardio ROS   Rhythm:Regular Rate:Normal     Neuro/Psych  Headaches, Anxiety    GI/Hepatic negative GI ROS, Neg liver ROS, GERD  Medicated and Controlled,  Endo/Other  negative endocrine ROSdiabetes  Renal/GU negative Renal ROS  negative genitourinary   Musculoskeletal negative musculoskeletal ROS (+)   Abdominal   Peds  Hematology negative hematology ROS (+)   Anesthesia Other Findings   Reproductive/Obstetrics                            Anesthesia Physical Anesthesia Plan  ASA: II and emergent  Anesthesia Plan: General   Post-op Pain Management:    Induction: Intravenous, Rapid sequence and Cricoid pressure planned  Airway Management Planned: Oral ETT  Additional Equipment:   Intra-op Plan:   Post-operative Plan: Extubation in OR  Informed Consent: I have reviewed the patients History and Physical, chart, labs and discussed the procedure including the risks, benefits and alternatives for the proposed anesthesia with the patient or authorized representative who has indicated his/her understanding and acceptance.   Dental advisory given  Plan Discussed with: CRNA  Anesthesia Plan Comments:        Anesthesia Quick Evaluation

## 2016-01-03 NOTE — ED Notes (Signed)
Patient given swab and mouth gel. NPO

## 2016-01-03 NOTE — ED Notes (Signed)
Surgeon at bedside.  

## 2016-01-03 NOTE — ED Notes (Signed)
Pt states he is unable to provide urine sample at this time

## 2016-01-04 ENCOUNTER — Encounter (HOSPITAL_COMMUNITY): Payer: Self-pay | Admitting: General Surgery

## 2016-01-04 LAB — CBC
HCT: 43.3 % (ref 39.0–52.0)
Hemoglobin: 14.7 g/dL (ref 13.0–17.0)
MCH: 32.3 pg (ref 26.0–34.0)
MCHC: 33.9 g/dL (ref 30.0–36.0)
MCV: 95.2 fL (ref 78.0–100.0)
PLATELETS: 254 10*3/uL (ref 150–400)
RBC: 4.55 MIL/uL (ref 4.22–5.81)
RDW: 12.9 % (ref 11.5–15.5)
WBC: 10 10*3/uL (ref 4.0–10.5)

## 2016-01-04 LAB — BASIC METABOLIC PANEL
Anion gap: 7 (ref 5–15)
BUN: 17 mg/dL (ref 6–20)
CALCIUM: 8.7 mg/dL — AB (ref 8.9–10.3)
CO2: 25 mmol/L (ref 22–32)
CREATININE: 1.22 mg/dL (ref 0.61–1.24)
Chloride: 108 mmol/L (ref 101–111)
GFR calc non Af Amer: >60 mL/min (ref >60)
Glucose, Bld: 110 mg/dL — ABNORMAL HIGH (ref 65–99)
Potassium: 4.1 mmol/L (ref 3.5–5.1)
SODIUM: 140 mmol/L (ref 135–145)

## 2016-01-04 MED ORDER — OXYCODONE-ACETAMINOPHEN 7.5-325 MG PO TABS: 1.0000 | ORAL_TABLET | ORAL | Status: DC | PRN

## 2016-01-04 NOTE — Discharge Summary (Signed)
Physician Discharge Summary  Patient ID: Dakota Anthony. MRN: [id number] DOB/AGE: June 25, 1966 50 y.o.  Admit date: 01/02/2016 Discharge date: 01/04/2016  Admission Diagnoses: Acute appendicitis  Discharge Diagnoses: Same Active Problems:   Acute appendicitis   Discharged Condition: good  Hospital Course: Patient is a 50 year old white male who presented to the emergency room on 01/02/2016 with worsening right lower quadrant abdominal pain. CT scan of the abdomen revealed acute appendicitis. He subsequently underwent laparoscopic appendectomy on 01/03/2016. Tolerated the procedure well. His postoperative course has been unremarkable. His diet was advanced without difficulty. His leukocytosis has resolved. The patient is being discharged home on 01/04/2016 in good and improving condition.  Treatments: surgery: Laparoscopic appendectomy on 01/03/2016  Discharge Exam: Blood pressure 123/79, pulse 78, temperature 97.6 F (36.4 C), temperature source Oral, resp. rate 20, height 6\' 1"  (1.854 m), weight 95.255 kg (210 lb), SpO2 95 %. General appearance: alert, cooperative and no distress Resp: clear to auscultation bilaterally Cardio: regular rate and rhythm, S1, S2 normal, no murmur, click, rub or gallop GI: Soft, dressings dry and intact.  Disposition: 01-Home or Self Care     Medication List    TAKE these medications        albuterol 108 (90 Base) MCG/ACT inhaler  Commonly known as:  PROVENTIL HFA;VENTOLIN HFA  Inhale 2 puffs into the lungs every 6 (six) hours as needed for wheezing or shortness of breath.     ALPRAZolam 1 MG tablet  Commonly known as:  XANAX  TAKE ONE TABLET BY MOUTH FOUR TIMES DAILY.     aspirin 81 MG tablet  Take 81 mg by mouth daily.     oxyCODONE-acetaminophen 7.5-325 MG tablet  Commonly known as:  PERCOCET  Take 1-2 tablets by mouth every 4 (four) hours as needed.           Follow-up Information    Follow up with Jamesetta So, MD. Schedule  an appointment as soon as possible for a visit on 01/10/2016.   Specialty:  General Surgery   Contact information:   [address] [id number] [phone]       Signed: Aviva Signs A 01/04/2016, 9:46 AM

## 2016-01-04 NOTE — Progress Notes (Signed)
Discharged PT per MD order and protocol. Reviewed discharge teaching, prescriptions given and work note. Pt verbalized understanding and left with all belongings. VSS. IV catheter D/C. Oswald Hillock, RN

## 2016-01-04 NOTE — Care Management Note (Signed)
Case Management Note  Patient Details  Name: Payson Launer. MRN: [id number] Date of Birth: Nov 21, 1965  Subjective/Objective:       Spoke with patient who is alert and oriented from home. Stated that they can afford medications and are able to make thier medical appointments without difficulty. No DME or O2 at home. No CM needs identified.             Action/Plan: Home with self care.   Expected Discharge Date:  01/04/16               Expected Discharge Plan:  Home/Self Care  In-House Referral:     Discharge planning Services  CM Consult  Post Acute Care Choice:    Choice offered to:     DME Arranged:    DME Agency:     HH Arranged:    HH Agency:     Status of Service:  Completed, signed off  Medicare Important Message Given:    Date Medicare IM Given:    Medicare IM give by:    Date Additional Medicare IM Given:    Additional Medicare Important Message give by:     If discussed at [place] of Stay Meetings, dates discussed:    Additional Comments:  Alvie Heidelberg, RN 01/04/2016, 10:13 AM

## 2016-01-04 NOTE — Anesthesia Postprocedure Evaluation (Signed)
Anesthesia Post Note  Patient: Dakota Anthony.  Procedure(s) Performed: Procedure(s) (LRB): APPENDECTOMY LAPAROSCOPIC (N/A)  Patient location during evaluation: Nursing Unit Anesthesia Type: General Level of consciousness: awake and alert and oriented Pain management: pain level controlled Respiratory status: spontaneous breathing Cardiovascular status: blood pressure returned to baseline Postop Assessment: no signs of nausea or vomiting Anesthetic complications: no    Last Vitals:  Filed Vitals:   01/04/16 0209 01/04/16 0704  BP: 138/55 123/79  Pulse: 86 78  Temp: 36.8 C 36.4 C  Resp: 18 20    Last Pain:  Filed Vitals:   01/04/16 0704  PainSc: 0-No pain                 Jaymee Tilson

## 2016-01-04 NOTE — Addendum Note (Signed)
Addendum  created 01/04/16 [id number] by Vista Deck, CRNA   Modules edited: Charges VN

## 2016-01-04 NOTE — Discharge Instructions (Signed)
Laparoscopic Appendectomy, Adult, Care After °Refer to this sheet in the next few weeks. These instructions provide you with information on caring for yourself after your procedure. Your caregiver may also give you more specific instructions. Your treatment has been planned according to current medical practices, but problems sometimes occur. Call your caregiver if you have any problems or questions after your procedure. °HOME CARE INSTRUCTIONS °· Do not drive while taking narcotic pain medicines. °· Use stool softener if you become constipated from your pain medicines. °· Change your bandages (dressings) as directed. °· Keep your wounds clean and dry. You may wash the wounds gently with soap and water. Gently pat the wounds dry with a clean towel. °· Do not take baths, swim, or use hot tubs for 10 days, or as instructed by your caregiver. °· Only take over-the-counter or prescription medicines for pain, discomfort, or fever as directed by your caregiver. °· You may continue your normal diet as directed. °· Do not lift more than 10 pounds (4.5 kg) or play contact sports for 3 weeks, or as directed. °· Slowly increase your activity after surgery. °· Take deep breaths to avoid getting a lung infection (pneumonia). °SEEK MEDICAL CARE IF: °· You have redness, swelling, or increasing pain in your wounds. °· You have pus coming from your wounds. °· You have drainage from a wound that lasts longer than 1 day. °· You notice a bad smell coming from the wounds or dressing. °· Your wound edges break open after stitches (sutures) have been removed. °· You notice increasing pain in the shoulders (shoulder strap areas) or near your shoulder blades. °· You develop dizzy episodes or fainting while standing. °· You develop shortness of breath. °· You develop persistent nausea or vomiting. °· You cannot control your bowel functions or lose your appetite. °· You develop diarrhea. °SEEK IMMEDIATE MEDICAL CARE IF:  °· You have a  fever. °· You develop a rash. °· You have difficulty breathing or sharp pains in your chest. °· You develop any reaction or side effects to medicines given. °MAKE SURE YOU: °· Understand these instructions. °· Will watch your condition. °· Will get help right away if you are not doing well or get worse. °  °This information is not intended to replace advice given to you by your health care provider. Make sure you discuss any questions you have with your health care provider. °  °Document Released: 10/06/2005 Document Revised: 02/20/2015 Document Reviewed: 03/26/2015 °Elsevier Interactive Patient Education ©2016 [employer] Inc. ° °

## 2016-01-04 NOTE — Addendum Note (Signed)
Addendum  created 01/04/16 1013 by Ollen Bowl, CRNA   Modules edited: Clinical Notes   Clinical Notes:  File: [id number]

## 2016-02-27 ENCOUNTER — Telehealth: Payer: Self-pay | Admitting: Family Medicine

## 2016-02-27 MED ORDER — ALBUTEROL SULFATE HFA 108 (90 BASE) MCG/ACT IN AERS: 2.0000 | INHALATION_SPRAY | Freq: Four times a day (QID) | RESPIRATORY_TRACT | Status: DC | PRN

## 2016-02-27 NOTE — Telephone Encounter (Signed)
Ok times one 

## 2016-02-27 NOTE — Telephone Encounter (Signed)
Pt is needing refills on his albuterol (PROVENTIL HFA;VENTOLIN HFA) 108 (90 BASE) MCG/ACT inhaler      [facility]

## 2016-02-27 NOTE — Telephone Encounter (Signed)
Rx sent electronically to pharmacy. Patient notified. 

## 2016-02-27 NOTE — Telephone Encounter (Signed)
Patient last seen over 2 years ago

## 2016-05-05 ENCOUNTER — Other Ambulatory Visit: Payer: Self-pay | Admitting: Family Medicine

## 2016-05-05 NOTE — Telephone Encounter (Signed)
Patient last seen 2015. May we refill?

## 2016-05-12 ENCOUNTER — Telehealth: Payer: Self-pay | Admitting: Family Medicine

## 2016-05-12 NOTE — Telephone Encounter (Signed)
Patient has a place on the end his nose, described as a large mole or bump, that he is requesting a referral to ENT for to have removed.

## 2016-05-12 NOTE — Telephone Encounter (Signed)
ntsw to get more description usually mole on nose goes to derm??and does pt have insur that requires ref ask brendale

## 2016-05-12 NOTE — Telephone Encounter (Signed)
Spoke with patient and informed him per Dr.Steve Luking- Usually for a mole on nose you would need to go to Dermatology. Informed patient that based on insurance no referral is necessary for appointment to Dermatology. Patient verbalized understanding.

## 2016-06-09 ENCOUNTER — Other Ambulatory Visit: Payer: Self-pay | Admitting: Family Medicine

## 2016-06-10 ENCOUNTER — Encounter: Payer: Self-pay | Admitting: Family Medicine

## 2016-06-10 ENCOUNTER — Ambulatory Visit (INDEPENDENT_AMBULATORY_CARE_PROVIDER_SITE_OTHER): Payer: BLUE CROSS/BLUE SHIELD | Admitting: Family Medicine

## 2016-06-10 VITALS — BP 128/86 | Ht 71.5 in | Wt 211.4 lb

## 2016-06-10 DIAGNOSIS — R5383 Other fatigue: Secondary | ICD-10-CM | POA: Diagnosis not present

## 2016-06-10 DIAGNOSIS — Z0000 Encounter for general adult medical examination without abnormal findings: Secondary | ICD-10-CM

## 2016-06-10 DIAGNOSIS — Z125 Encounter for screening for malignant neoplasm of prostate: Secondary | ICD-10-CM

## 2016-06-10 DIAGNOSIS — Z0189 Encounter for other specified special examinations: Secondary | ICD-10-CM

## 2016-06-10 DIAGNOSIS — Z13220 Encounter for screening for lipoid disorders: Secondary | ICD-10-CM | POA: Diagnosis not present

## 2016-06-10 MED ORDER — ALBUTEROL SULFATE HFA 108 (90 BASE) MCG/ACT IN AERS: INHALATION_SPRAY | RESPIRATORY_TRACT | 2 refills | Status: DC

## 2016-06-10 NOTE — Progress Notes (Signed)
   Subjective:    Patient ID: Dakota Sneddon., male    DOB: [birth year], 50 y.o.   MRN: [id number]  HPI The patient comes in today for a wellness visit.  Fatigue and tired ness,  Pt has left nasal wart and want s tosee an ent phusician  They already have sched to take the wart out in sept    A review of their health history was completed.  A review of medications was also completed.  Any needed refills; needs refill of inhaler  Eating habits: good -gained some weight  Falls/  MVA accidents in past few months: none  Regular exercise: walk alot  Specialist pt sees on regular basis: none  Preventative health issues were discussed.   Additional concerns: frequent headaches  Trouble sleeping often   no drinking energy drins but does drink a fair amnt of caffeine  Urates more freq now but gernally does nt get up at night , walks probbably a mile or a nile and a half  Patient notes great challenge with profound fatigue. Worse when he doesn't sleep well at night. No obvious snoring. Not exercising much these days. Unfortunately has gained some weight also   Review of Systems  Constitutional: Negative for activity change, appetite change and fever.  HENT: Negative for congestion and rhinorrhea.   Eyes: Negative for discharge.  Respiratory: Negative for cough and wheezing.   Cardiovascular: Negative for chest pain.  Gastrointestinal: Negative for abdominal pain, blood in stool and vomiting.  Genitourinary: Negative for difficulty urinating and frequency.  Musculoskeletal: Negative for neck pain.  Skin: Negative for rash.  Allergic/Immunologic: Negative for environmental allergies and food allergies.  Neurological: Negative for weakness and headaches.  Psychiatric/Behavioral: Negative for agitation.  All other systems reviewed and are negative.      Objective:   Physical Exam  Constitutional: He appears well-developed and well-nourished.  HENT:  Head: Normocephalic  and atraumatic.  Right Ear: External ear normal.  Left Ear: External ear normal.  Nose: Nose normal.  Mouth/Throat: Oropharynx is clear and moist.  Eyes: EOM are normal. Pupils are equal, round, and reactive to light.  Neck: Normal range of motion. Neck supple. No thyromegaly present.  Cardiovascular: Normal rate, regular rhythm and normal heart sounds.   No murmur heard. Pulmonary/Chest: Effort normal and breath sounds normal. No respiratory distress. He has no wheezes.  Abdominal: Soft. Bowel sounds are normal. He exhibits no distension and no mass. There is no tenderness.  Genitourinary: Penis normal.  Musculoskeletal: Normal range of motion. He exhibits no edema.  Lymphadenopathy:    He has no cervical adenopathy.  Neurological: He is alert. He exhibits normal muscle tone.  Skin: Skin is warm and dry. No erythema.  Psychiatric: He has a normal mood and affect. His behavior is normal. Judgment normal.  Vitals reviewed.         Assessment & Plan:  Impression 1 well adult exam #2 fatigue discussed fairly profound. Warrants further blood work #3 probable element of prostate hypertrophy discussed plan colonoscopy encouraged she given. Appropriate blood work. Diet exercise discussed and encouraged WSL

## 2016-06-12 ENCOUNTER — Other Ambulatory Visit: Payer: Self-pay | Admitting: Family Medicine

## 2016-06-12 ENCOUNTER — Telehealth: Payer: Self-pay

## 2016-06-12 NOTE — Telephone Encounter (Signed)
Pt called to be triaged. Call [phone]

## 2016-06-13 NOTE — Telephone Encounter (Signed)
This is strange, when I saw pt recently for first time in over two yrs he told me he had come off these meds, I think he should remain off, if he want s to sched visit to disc but no guarantees, was really taking too much before

## 2016-06-17 ENCOUNTER — Encounter (INDEPENDENT_AMBULATORY_CARE_PROVIDER_SITE_OTHER): Payer: Self-pay | Admitting: *Deleted

## 2016-06-17 ENCOUNTER — Encounter: Payer: Self-pay | Admitting: Family Medicine

## 2016-06-17 ENCOUNTER — Telehealth: Payer: Self-pay | Admitting: Family Medicine

## 2016-06-17 DIAGNOSIS — Z1211 Encounter for screening for malignant neoplasm of colon: Secondary | ICD-10-CM

## 2016-06-17 NOTE — Telephone Encounter (Signed)
Referral in system

## 2016-06-17 NOTE — Telephone Encounter (Signed)
Pt needs referral to Dr. Laural Golden for screening colonoscopy (for tracking in [place])  Please initiate in system so I may process

## 2016-06-19 ENCOUNTER — Telehealth: Payer: Self-pay | Admitting: Family Medicine

## 2016-06-19 ENCOUNTER — Telehealth: Payer: Self-pay

## 2016-06-19 NOTE — Telephone Encounter (Signed)
Pt was referred to Dr. Laural Golden and they cannot see him until first of the year. His wife, Santiago Glad, said she is going to call Dr. Lance Sell office and have them send the referral to [place].

## 2016-06-19 NOTE — Telephone Encounter (Signed)
Santiago Glad (pt's fiance) called to ask about pt's Alprazolam refill request  Explained to her that we do not have proper documentation on file to discuss this with her, she may have the pt to call us concerning this or he needs to complete a DPR giving [place] permission to speak with her, she verbalized understanding  Refill was denied, see below for Dr. Jeannine Kitten note    Marjory Sneddon.  06/12/2016  Refill  MRN:  [id number]  Description: 50 year old male Provider: Mikey Kirschner, MD Department: [facility] Med Call Documentation   Mikey Kirschner, MD at 06/13/2016 2:46 PM   Status: Signed    This is strange, when I saw pt recently for first time in over two yrs he told me he had come off these meds, I think he should remain off, if he want s to sched visit to disc but no guarantees, was really taking too much before  Routing History   Priority Sent On From To Message Type  06/13/2016 3:41 PM Launa Grill, LPN Mikey Kirschner, MD Rx Response  06/13/2016 2:47 PM Mikey Kirschner, MD [facility] Clinical Pool Rx Request  06/12/2016 11:37 AM Carmelina Noun, LPN Mikey Kirschner, MD Rx Request  06/12/2016 11:24 AM Interface, Surescripts Out [facility] Rx Refill Pool Rx Request Created by   Interface, Surescripts Out on 06/12/2016 11:24 AM Diagnoses   None.    Refused    Disp Refills Start End ALPRAZolam (XANAX) 1 MG tablet [Pharmacy Med Name: ALPRAZOLAM 1 MG TABLET] 120 tablet 0 06/13/2016  Sig:  TAKE ONE TABLET BY MOUTH FOUR TIMES DAILY. Class:  Normal DAW:  No Reason for Refusal:  Patient should contact provider first Refused By:  Launa Grill, [place], [place] Contacts    Type Contact Phone 06/12/2016 11:24 AM Interface (Incoming) [place] [phone]

## 2016-06-27 ENCOUNTER — Telehealth: Payer: Self-pay

## 2016-06-30 NOTE — Telephone Encounter (Signed)
Gastroenterology Pre-Procedure Review  Request Date: 06/27/2016 Requesting Physician: Dr. Sallee Lange  PATIENT REVIEW QUESTIONS: The patient responded to the following health history questions as indicated:    1. Diabetes Melitis: no 2. Joint replacements in the past 12 months: no 3. Major health problems in the past 3 months: no 4. Has an artificial valve or MVP: no 5. Has a defibrillator: no 6. Has been advised in past to take antibiotics in advance of a procedure like teeth cleaning: no 7. Family history of colon cancer: no  8. Alcohol Use: no 9. History of sleep apnea: no     MEDICATIONS & ALLERGIES:    Patient reports the following regarding taking any blood thinners:   Plavix? no Aspirin? no Coumadin? no  Patient confirms/reports the following medications:  Current Outpatient Prescriptions  Medication Sig Dispense Refill  . albuterol (PROAIR HFA) 108 (90 Base) MCG/ACT inhaler INHALE 2 PUFFS INTO THE LUNGS EVERY SIX HOURS AS NEEDED FOR WHEEZING OR SHORTNESS OF BREATH. 1 Inhaler 2  . aspirin 81 MG tablet Take 81 mg by mouth daily.       No current facility-administered medications for this visit.     Patient confirms/reports the following allergies:  No Known Allergies  No orders of the defined types were placed in this encounter.   AUTHORIZATION INFORMATION Primary Insurance:   ID #:   Group #:  Pre-Cert / Auth required:  Pre-Cert / Auth #:   Secondary Insurance:  ID #:  Group #:  Pre-Cert / Auth required:  Pre-Cert / Auth #:   SCHEDULE INFORMATION: Procedure has been scheduled as follows:  Date:  07/16/2016             Time: 9:30 AM  Location:   This Gastroenterology Pre-Precedure Review Form is being routed to the following provider(s): R. Garfield Cornea, MD

## 2016-06-30 NOTE — Telephone Encounter (Signed)
Ok to schedule.

## 2016-07-02 NOTE — Telephone Encounter (Signed)
See triage

## 2016-07-08 ENCOUNTER — Other Ambulatory Visit: Payer: Self-pay

## 2016-07-08 DIAGNOSIS — Z1211 Encounter for screening for malignant neoplasm of colon: Secondary | ICD-10-CM

## 2016-07-08 MED ORDER — PEG 3350-KCL-NA BICARB-NACL 420 G PO SOLR: 4000.0000 mL | ORAL | 0 refills | Status: DC

## 2016-07-08 NOTE — Telephone Encounter (Signed)
Pt was changed from 07/16/2016 to 07/17/2016 at 1:15 pm.  Pt's wife is aware and Rx sent to pharmacy and instructions mailed to pt.

## 2016-07-10 NOTE — Telephone Encounter (Signed)
Pt came by and filled out a DPR for his fiancee. Pt is requesting a call back from the nurse regarding this. Pt states that he asked Dr. Richardson Landry about his alprazolam when he was seen but Dr. Richardson Landry didn't say whether or not he was going to send them in.

## 2016-07-11 NOTE — Telephone Encounter (Signed)
Spoke with patient and informed him per Dr.Steve Luking-  He will need an office visit to discuss restarting medication. Patient verbalized understanding.

## 2016-07-11 NOTE — Telephone Encounter (Signed)
Left message return call 07/11/16

## 2016-07-14 ENCOUNTER — Telehealth: Payer: Self-pay

## 2016-07-14 NOTE — Telephone Encounter (Signed)
I called [employer] @ [phone] and spoke to Alexus C who said that a PA is not required for a screening colonoscopy. Reference # [id number].

## 2016-07-14 NOTE — Telephone Encounter (Signed)
I called [employer] @ [phone] and spoke to Alexus C who said that a PA is not required for a screening colonoscopy. Ref # [id number]

## 2016-07-17 ENCOUNTER — Encounter (HOSPITAL_COMMUNITY)
Admission: RE | Disposition: A | Discharge status: Home / Self Care | Payer: Self-pay | Source: Ambulatory Visit | Attending: Internal Medicine

## 2016-07-17 ENCOUNTER — Encounter (HOSPITAL_COMMUNITY): Payer: Self-pay | Admitting: *Deleted

## 2016-07-17 ENCOUNTER — Ambulatory Visit (HOSPITAL_COMMUNITY)
Admission: RE | Admit: 2016-07-17 | Discharge: 2016-07-17 | Disposition: A | Discharge status: Home / Self Care | Payer: BLUE CROSS/BLUE SHIELD | Source: Ambulatory Visit | Attending: Internal Medicine | Admitting: Internal Medicine

## 2016-07-17 DIAGNOSIS — G43909 Migraine, unspecified, not intractable, without status migrainosus: Secondary | ICD-10-CM | POA: Insufficient documentation

## 2016-07-17 DIAGNOSIS — K219 Gastro-esophageal reflux disease without esophagitis: Secondary | ICD-10-CM | POA: Insufficient documentation

## 2016-07-17 DIAGNOSIS — Z8249 Family history of ischemic heart disease and other diseases of the circulatory system: Secondary | ICD-10-CM | POA: Insufficient documentation

## 2016-07-17 DIAGNOSIS — Z7984 Long term (current) use of oral hypoglycemic drugs: Secondary | ICD-10-CM | POA: Diagnosis not present

## 2016-07-17 DIAGNOSIS — Z1211 Encounter for screening for malignant neoplasm of colon: Secondary | ICD-10-CM | POA: Insufficient documentation

## 2016-07-17 DIAGNOSIS — K5730 Diverticulosis of large intestine without perforation or abscess without bleeding: Secondary | ICD-10-CM | POA: Insufficient documentation

## 2016-07-17 DIAGNOSIS — D128 Benign neoplasm of rectum: Secondary | ICD-10-CM | POA: Diagnosis not present

## 2016-07-17 DIAGNOSIS — E119 Type 2 diabetes mellitus without complications: Secondary | ICD-10-CM | POA: Diagnosis not present

## 2016-07-17 DIAGNOSIS — J45909 Unspecified asthma, uncomplicated: Secondary | ICD-10-CM | POA: Insufficient documentation

## 2016-07-17 DIAGNOSIS — Z1212 Encounter for screening for malignant neoplasm of rectum: Secondary | ICD-10-CM

## 2016-07-17 DIAGNOSIS — F419 Anxiety disorder, unspecified: Secondary | ICD-10-CM | POA: Insufficient documentation

## 2016-07-17 DIAGNOSIS — Z87891 Personal history of nicotine dependence: Secondary | ICD-10-CM | POA: Diagnosis not present

## 2016-07-17 DIAGNOSIS — Z7982 Long term (current) use of aspirin: Secondary | ICD-10-CM | POA: Insufficient documentation

## 2016-07-17 DIAGNOSIS — M1990 Unspecified osteoarthritis, unspecified site: Secondary | ICD-10-CM | POA: Insufficient documentation

## 2016-07-17 HISTORY — PX: COLONOSCOPY: SHX5424

## 2016-07-17 SURGERY — COLONOSCOPY
Anesthesia: Moderate Sedation

## 2016-07-17 MED ORDER — ONDANSETRON HCL 4 MG/2ML IJ SOLN
INTRAMUSCULAR | Status: DC | PRN
  Administered 2016-07-17: 4 mg via INTRAVENOUS

## 2016-07-17 MED ORDER — SODIUM CHLORIDE 0.9 % IV SOLN
INTRAVENOUS | Status: DC
  Administered 2016-07-17: 1000 mL via INTRAVENOUS

## 2016-07-17 MED ORDER — MIDAZOLAM HCL 5 MG/5ML IJ SOLN
INTRAMUSCULAR | Status: DC | PRN
  Administered 2016-07-17: 1 mg via INTRAVENOUS
  Administered 2016-07-17 (×2): 2 mg via INTRAVENOUS

## 2016-07-17 MED ORDER — MIDAZOLAM HCL 5 MG/5ML IJ SOLN
INTRAMUSCULAR | Status: AC
  Filled 2016-07-17: qty 10

## 2016-07-17 MED ORDER — MEPERIDINE HCL 100 MG/ML IJ SOLN
INTRAMUSCULAR | Status: DC | PRN
  Administered 2016-07-17: 50 mg via INTRAVENOUS
  Administered 2016-07-17: 25 mg via INTRAVENOUS
  Administered 2016-07-17: 50 mg via INTRAVENOUS

## 2016-07-17 MED ORDER — MEPERIDINE HCL 100 MG/ML IJ SOLN
INTRAMUSCULAR | Status: AC
  Filled 2016-07-17: qty 2

## 2016-07-17 MED ORDER — ONDANSETRON HCL 4 MG/2ML IJ SOLN
INTRAMUSCULAR | Status: AC
  Filled 2016-07-17: qty 2

## 2016-07-17 NOTE — Op Note (Signed)
[facility] Patient Name: Dakota Anthony Procedure Date: 07/17/2016 1:36 PM MRN: [id number] Date of Birth: May 13, 1966 Attending MD: Norvel Richards , MD CSN: [id number] Age: 50 Admit Type: Outpatient Procedure:                Colonoscopy Indications:              Screening for colorectal malignant neoplasm Providers:                Norvel Richards, MD, Rosina Lowenstein, RN, Sherlyn Lees, Technician Referring MD:              Medicines:                Midazolam 4 mg IV, Meperidine 125 mg IV,                            Ondansetron 4 mg IV Complications:            No immediate complications. Estimated Blood Loss:     Estimated blood loss was minimal. Procedure:                Pre-Anesthesia Assessment:                           - Prior to the procedure, a History and Physical                            was performed, and patient medications and                            allergies were reviewed. The patient's tolerance of                            previous anesthesia was also reviewed. The risks                            and benefits of the procedure and the sedation                            options and risks were discussed with the patient.                            All questions were answered, and informed consent                            was obtained. Prior Anticoagulants: The patient has                            taken no previous anticoagulant or antiplatelet                            agents. ASA Grade Assessment: II - A patient with  mild systemic disease. After reviewing the risks                            and benefits, the patient was deemed in                            satisfactory condition to undergo the procedure.                           After obtaining informed consent, the colonoscope                            was passed under direct vision. Throughout the                            procedure,  the patient's blood pressure, pulse, and                            oxygen saturations were monitored continuously. The                            EC38-i10L [id number]) scope was introduced through                            the anus and advanced to the the cecum, identified                            by appendiceal orifice and ileocecal valve. The                            ileocecal valve, appendiceal orifice, and rectum                            were photographed. The entire colon was well                            visualized. The colonoscopy was performed without                            difficulty. The patient tolerated the procedure                            well. The quality of the bowel preparation was                            adequate. The entire colon was well visualized. Scope In: 1:46:18 PM Scope Out: 2:05:01 PM Total Procedure Duration: 0 hours 18 minutes 43 seconds  Findings:      The perianal and digital rectal examinations were normal.      A 5 mm polyp was found in the rectum. The polyp was semi-pedunculated.       The polyp was removed with a cold snare. Resection and retrieval were       complete. Estimated blood loss was minimal.      Scattered small  and large-mouthed diverticula were found in the sigmoid       colon. . Impression:               - One 5 mm polyp in the rectum, removed with a cold                            snare. Resected and retrieved.                           - Diverticulosis in the sigmoid colon. Moderate Sedation:      Moderate (conscious) sedation was administered by the endoscopy nurse       and supervised by the endoscopist. The following parameters were       monitored: oxygen saturation, heart rate, blood pressure, respiratory       rate, EKG, adequacy of pulmonary ventilation, and response to care.       Total physician intraservice time was 26 minutes. Recommendation:           - Patient has a contact number available for                             emergencies. The signs and symptoms of potential                            delayed complications were discussed with the                            patient. Return to normal activities tomorrow.                            Written discharge instructions were provided to the                            patient.                           - Advance diet as tolerated.                           - Continue present medications.                           - Repeat colonoscopy date to be determined after                            pending pathology results are reviewed for                            surveillance based on pathology results.                           - Return to GI office PRN. Procedure Code(s):        --- Professional ---                           [phone], Colonoscopy, flexible; with removal of  tumor(s), polyp(s), or other lesion(s) by snare                            technique                           99152, Moderate sedation services provided by the                            same physician or other qualified health care                            professional performing the diagnostic or                            therapeutic service that the sedation supports,                            requiring the presence of an independent trained                            observer to assist in the monitoring of the                            patient's level of consciousness and physiological                            status; initial 15 minutes of intraservice time,                            patient age 54 years or older                           [phone], Moderate sedation services; each additional                            15 minutes intraservice time Diagnosis Code(s):        --- Professional ---                           Z12.11, Encounter for screening for malignant                            neoplasm of colon                           K62.1,  Rectal polyp                           K57.30, Diverticulosis of large intestine without                            perforation or abscess without bleeding CPT copyright 2016 American Medical Association. All rights reserved. The codes documented in this report are preliminary and upon coder review may  be revised to meet current compliance requirements. Cristopher Estimable. December Hedtke,  MD Norvel Richards, MD 07/17/2016 2:16:26 PM This report has been signed electronically. Number of Addenda: 0

## 2016-07-17 NOTE — Discharge Instructions (Addendum)

## 2016-07-17 NOTE — H&P (Signed)
@LOGO @   Primary Care Physician:  Mickie Hillier, MD Primary Gastroenterologist:  Dr. Gala Romney  Pre-Procedure History & Physical: HPI:  Dakota Seba. is a 50 y.o. male is here for a screening colonoscopy. No bowel symptoms. No  Family history of any first-degree relatives with colon cancer. No prior colonoscopy.  Past Medical History:  Diagnosis Date  . Anemia    age 64  had to take iron shots  . Anxiety    Possible bipolar disorder  . Arthritis    hands  . Asthma   . Chest discomfort   . Chronic back pain   . Diabetes mellitus without complication (HCC)    hypoglycemia  . GERD (gastroesophageal reflux disease)    tums occassionally  . Left eye injury    Traumatic damage  . Migraine headache   . Shortness of breath dyspnea     Past Surgical History:  Procedure Laterality Date  . APPENDECTOMY    . CATARACT EXTRACTION     From left eye following injury as a teenager  . CHOLECYSTECTOMY  10/31/2011   Procedure: LAPAROSCOPIC CHOLECYSTECTOMY;  Surgeon: Jamesetta So;  Location: [facility];  Service: General;  Laterality: N/A;  . LAPAROSCOPIC APPENDECTOMY N/A 01/03/2016   Procedure: APPENDECTOMY LAPAROSCOPIC;  Surgeon: Aviva Signs, MD;  Location: [facility];  Service: General;  Laterality: N/A;    Prior to Admission medications   Medication Sig Start Date End Date Taking? Authorizing Provider  albuterol (PROAIR HFA) 108 (90 Base) MCG/ACT inhaler INHALE 2 PUFFS INTO THE LUNGS EVERY SIX HOURS AS NEEDED FOR WHEEZING OR SHORTNESS OF BREATH. 06/10/16  Yes Mikey Kirschner, MD  polyethylene glycol-electrolytes (TRILYTE) 420 g solution Take 4,000 mLs by mouth as directed. 07/08/16  Yes Daneil Dolin, MD  aspirin 81 MG tablet Take 81 mg by mouth daily.      Historical Provider, MD    Allergies as of 07/08/2016  . (No Known Allergies)    Family History  Problem Relation Age of Onset  . Kidney failure Mother   . Hypertension Mother   . Diabetes Mother   . Cancer Father   . Cirrhosis  Father     Hepatic  . Colon cancer Paternal Uncle   . Anesthesia problems Neg Hx   . Hypotension Neg Hx   . Malignant hyperthermia Neg Hx   . Pseudochol deficiency Neg Hx     Social History   Social History  . Marital status: Married    Spouse name: N/A  . Number of children: N/A  . Years of education: N/A   Occupational History  . [employer], warehouse    Social History Main Topics  . Smoking status: Never Smoker  . Smokeless tobacco: Former Systems developer  . Alcohol use No     Comment: No excessive use  . Drug use: No  . Sexual activity: Not on file   Other Topics Concern  . Not on file   Social History Narrative   Married and lives locally   2 children    No excessive use    Review of Systems: See HPI, otherwise negative ROS  Physical Exam: BP (!) 134/92   Pulse 75   Temp 97.6 F (36.4 C) (Oral)   Resp 17   Ht 6' (1.829 m)   Wt 211 lb (95.7 kg)   SpO2 96%   BMI 28.62 kg/m  General:   Alert,  Well-developed, well-nourished, pleasant and cooperative in NAD Head:  Normocephalic and atraumatic.  eezes, crackles, or rhonchi. No acute distress. Heart:  Regular rate and rhythm; no murmurs, clicks, rubs,  or gallops. Abdomen:  Soft, nontender and nondistended. No masses, hepatosplenomegaly or hernias noted. Normal bowel sounds, without guarding, and without rebound.    Impression/Plan: Dakota Sneddon. is now here to undergo a screening colonoscopy.  First ever average risk screening examination.  Risks, benefits, limitations, imponderables and alternatives regarding colonoscopy have been reviewed with the patient. Questions have been answered. All parties agreeable.     Notice:  This dictation was prepared with Dragon dictation along with smaller phrase technology. Any transcriptional errors that result from this process are unintentional and may not be corrected upon review.

## 2016-07-23 ENCOUNTER — Encounter (HOSPITAL_COMMUNITY): Payer: Self-pay | Admitting: Internal Medicine

## 2016-07-23 ENCOUNTER — Encounter: Payer: Self-pay | Admitting: Internal Medicine

## 2016-08-05 ENCOUNTER — Ambulatory Visit (INDEPENDENT_AMBULATORY_CARE_PROVIDER_SITE_OTHER): Payer: BLUE CROSS/BLUE SHIELD | Admitting: Family Medicine

## 2016-08-05 ENCOUNTER — Encounter: Payer: Self-pay | Admitting: Family Medicine

## 2016-08-05 VITALS — BP 132/88 | Ht 72.0 in | Wt 214.0 lb

## 2016-08-05 DIAGNOSIS — F5101 Primary insomnia: Secondary | ICD-10-CM | POA: Diagnosis not present

## 2016-08-05 DIAGNOSIS — F411 Generalized anxiety disorder: Secondary | ICD-10-CM

## 2016-08-05 MED ORDER — ALPRAZOLAM 1 MG PO TABS: ORAL_TABLET | ORAL | 5 refills | Status: DC

## 2016-08-05 NOTE — Progress Notes (Signed)
   Subjective:    Patient ID: Dakota Anthony., male    DOB: Mar 14, 1966, 50 y.o.   MRN: [id number] Patient arrives office for follow-up of chronic concerns. Insomnia  Episode onset: june. Past treatments include nothing. How long after going to bed to you fall asleep: over an hour.     Pt wants to try xanax to help sleep. Notes that it has helped in the past  Has challenges with early mor awakening,  And trouble trying to get bk tosleep is a challenge  nyquil does not help  History of substantial anxiety and the past ongoing challenges although overall  Review of Systems  Psychiatric/Behavioral: The patient has insomnia.        Objective:   Physical Exam Alert vitals stable, NAD. Blood pressure good on repeat. HEENT normal. Lungs clear. Heart regular rate and rhythm.        Assessment & Plan:  Impression difficult anxiety with element of major insomnia issues. Plan Xanax 1 mg patient states is always required to these at night to help 1-2 daily at bedtime when necessary. Rationale discussed exercise encourage

## 2016-08-08 DIAGNOSIS — G4700 Insomnia, unspecified: Secondary | ICD-10-CM | POA: Insufficient documentation

## 2016-10-06 ENCOUNTER — Other Ambulatory Visit: Payer: Self-pay | Admitting: Family Medicine

## 2017-01-29 ENCOUNTER — Other Ambulatory Visit: Payer: Self-pay | Admitting: Family Medicine

## 2017-02-02 NOTE — Telephone Encounter (Signed)
Ok plus 3 monthly ref 

## 2017-02-02 NOTE — Telephone Encounter (Signed)
Dr. Steeves patient please 

## 2017-02-04 ENCOUNTER — Telehealth: Payer: Self-pay | Admitting: Family Medicine

## 2017-02-04 NOTE — Telephone Encounter (Signed)
Medication was refill on yesterday and prescription was faxed to pharmacy. Notified patient and patient verbalized understanding.

## 2017-02-04 NOTE — Telephone Encounter (Signed)
Six mo worth ok 

## 2017-02-04 NOTE — Telephone Encounter (Signed)
Requesting Rx for Alprazolam to [facility].

## 2017-04-28 IMAGING — CT CT ABD-PELV W/ CM
2 of 5 series · 17 of 46 positions shown, 19 images · IV contrast (omnipaque)
Comparison: None.

CLINICAL DATA: Abdominal pain and distension.

EXAM:
CT ABDOMEN AND PELVIS WITH CONTRAST
TECHNIQUE: Multidetector CT imaging of the abdomen and pelvis was performed
using the standard protocol following bolus administration of
intravenous contrast.
CONTRAST:  100mL OMNIPAQUE IOHEXOL 300 MG/ML  SOLN

[Series 2: abd_pel_with 5.0 b40f · axial · 0.75mm/px · z∈[-526,-66]mm · 14 of 106 slices shown, 16 images]
[im 7/106  soft-tissue]
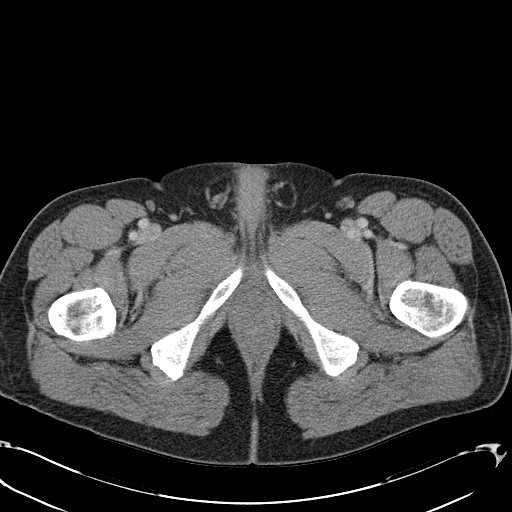
[im 7/106  bone]
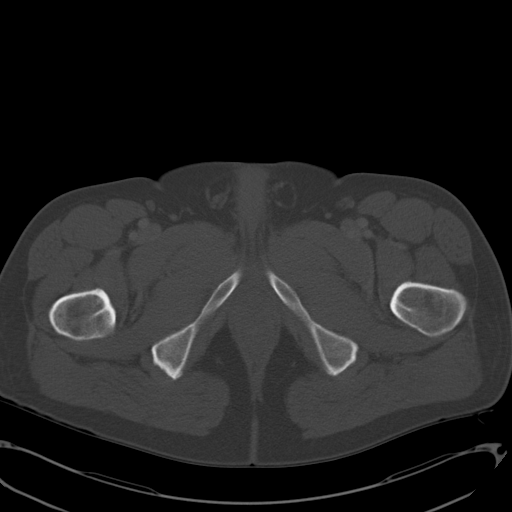
[im 13/106  soft-tissue]
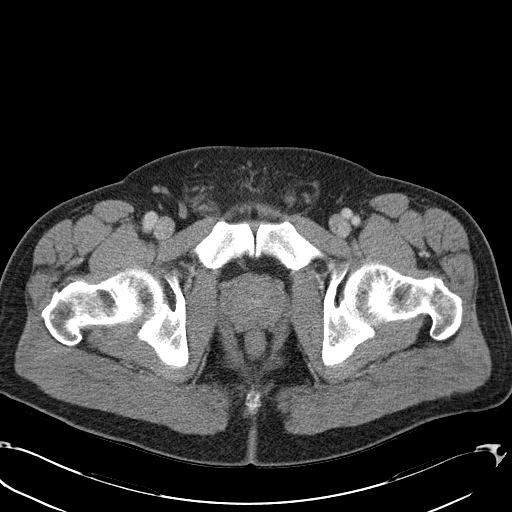
[im 19/106  soft-tissue]
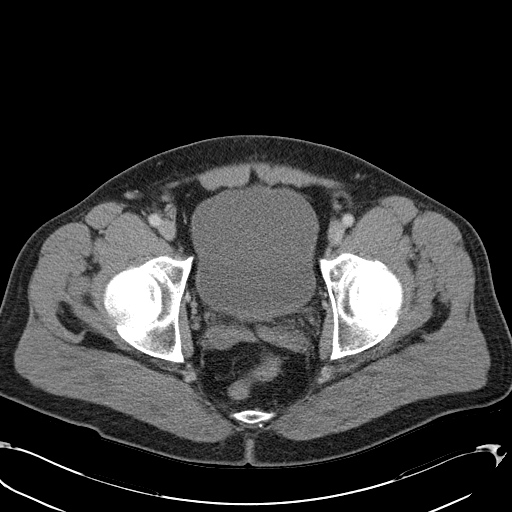
[im 31/106  soft-tissue]
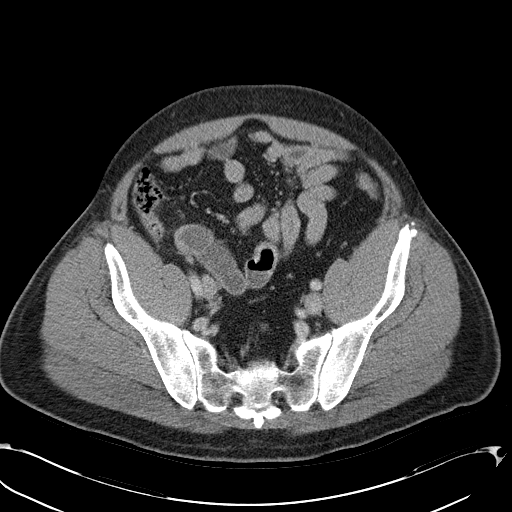
[im 38/106  soft-tissue]
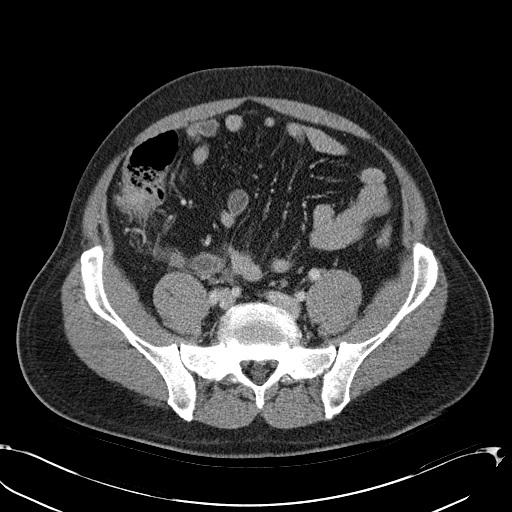
[im 44/106  soft-tissue]
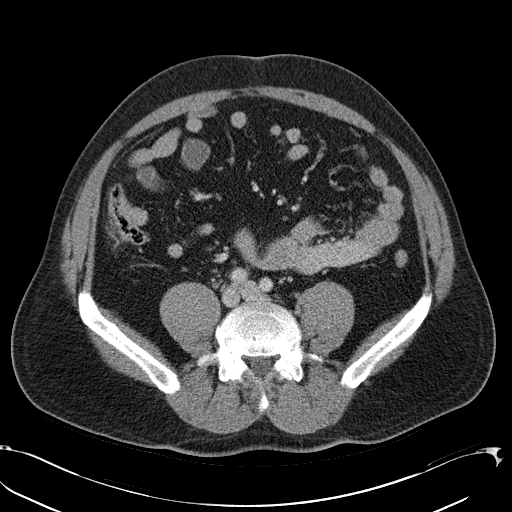
[im 50/106  soft-tissue]
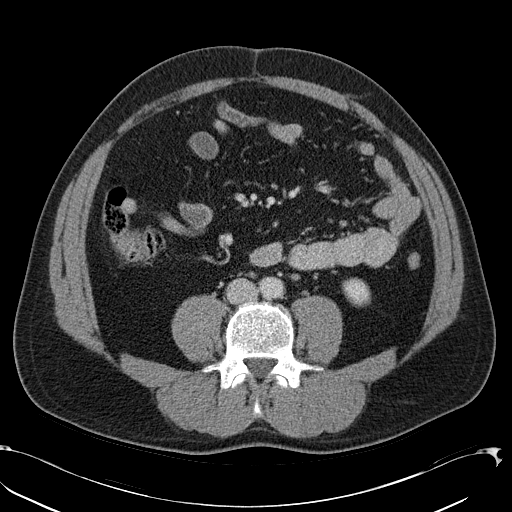
[im 56/106  soft-tissue]
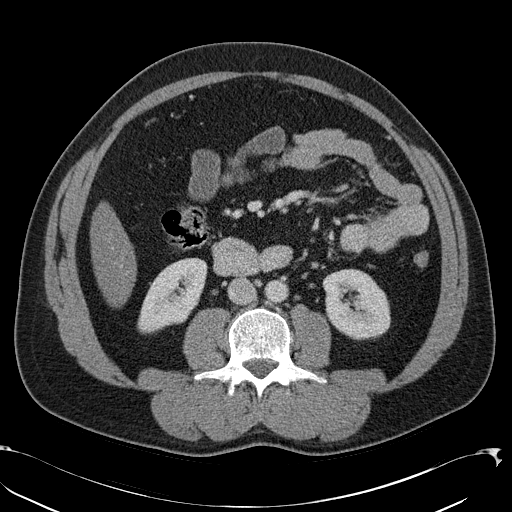
[im 62/106  soft-tissue]
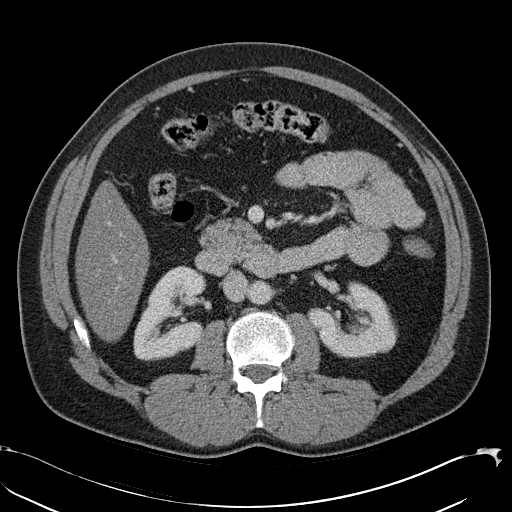
[im 62/106  bone]
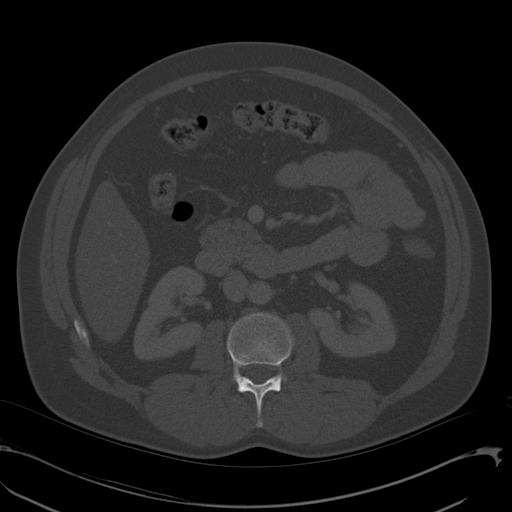
[im 68/106  soft-tissue]
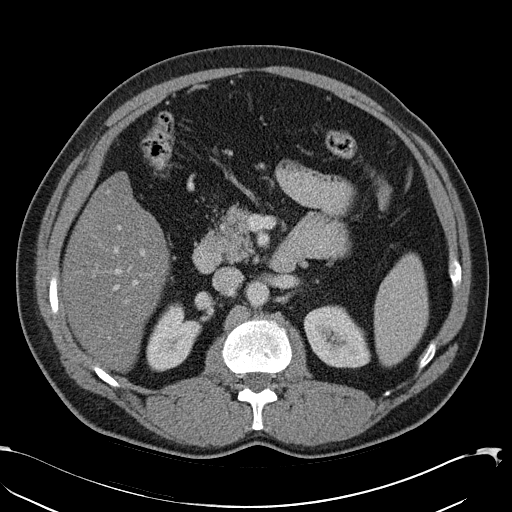
[im 81/106  soft-tissue]
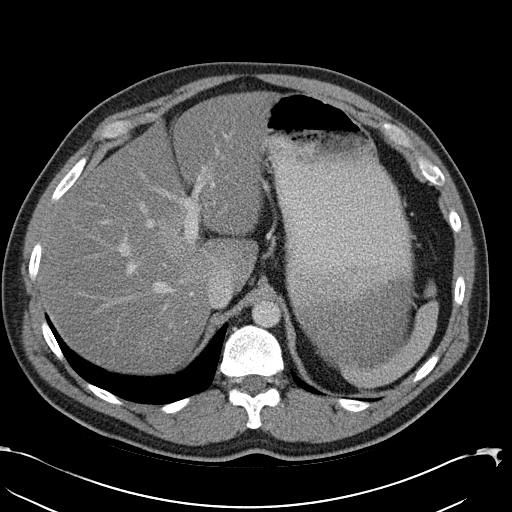
[im 87/106  soft-tissue]
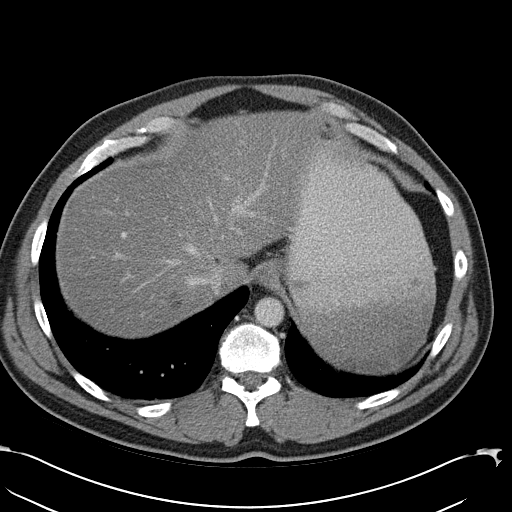
[im 93/106  soft-tissue]
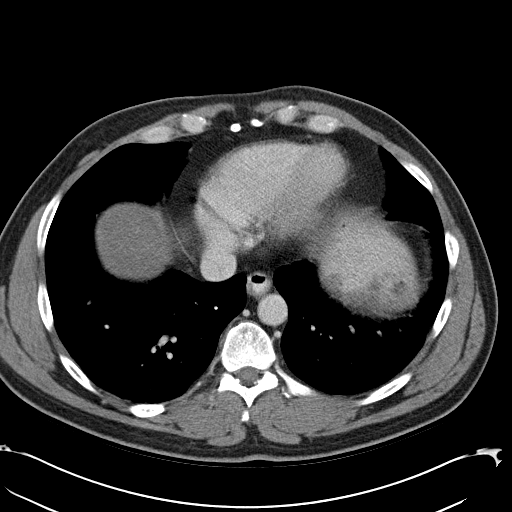
[im 99/106  soft-tissue]
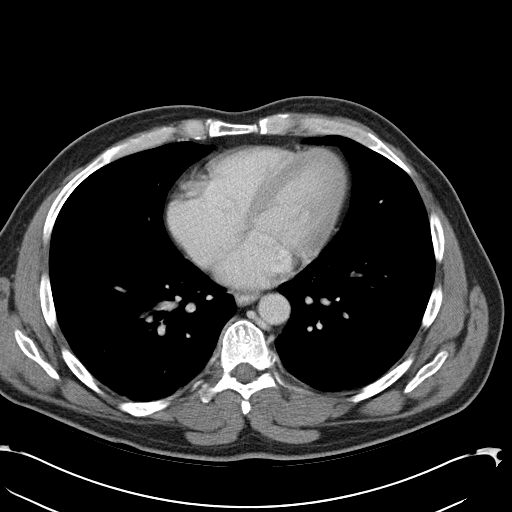

[Series 3: abd_pel_with 3.0 spo cor · coronal · 0.90mm/px · 3 of 108 slices shown]
[im 36/108  soft-tissue]
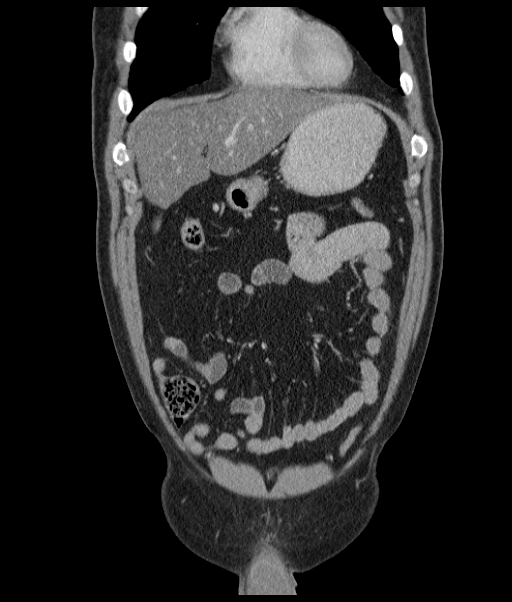
[im 48/108  soft-tissue]
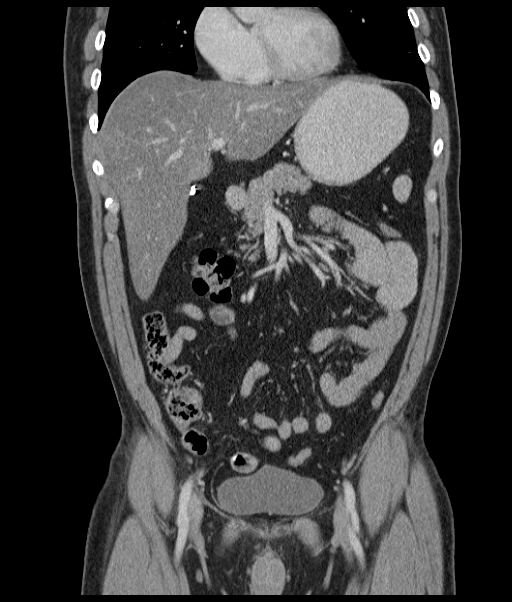
[im 60/108  soft-tissue]
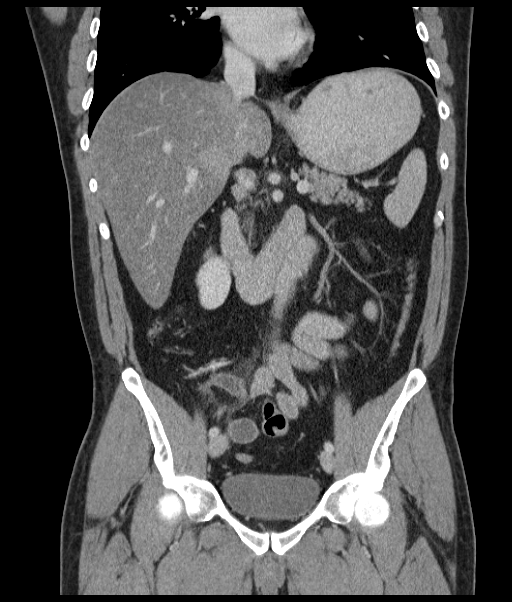

[17 of 46 positions shown; findings below may reference images not displayed]

FINDINGS: Lower chest and abdominal wall:  No contributory findings.

Hepatobiliary: Marked hepatic steatosis. Probable 1 cm cyst in the
upper right liver.Cholecystectomy with normal common bile duct.

Pancreas: Unremarkable.

Spleen: Unremarkable.

Adrenals/Urinary Tract: Negative adrenals. No hydronephrosis or
stone. Unremarkable bladder.

Reproductive:No pathologic findings.

Stomach/Bowel: The appendix is dilated and thick walled with
mesoappendix inflammation. At its greatest diameter, the appendix
measures 16 mm. No abscess or perforation seen. The appendix extends
medially and posteriorly from the cecum. No bowel obstruction.

Vascular/Lymphatic: No acute vascular abnormality. No mass or
adenopathy.

Peritoneal: No ascites or pneumoperitoneum.

Musculoskeletal: Negative
IMPRESSION: 1. Acute suppurative appendicitis without perforation.
2. Hepatic steatosis.

## 2017-05-28 ENCOUNTER — Other Ambulatory Visit: Payer: Self-pay | Admitting: Family Medicine

## 2017-05-28 NOTE — Telephone Encounter (Signed)
Ok times one, rec wellness or ck up

## 2017-06-29 ENCOUNTER — Other Ambulatory Visit: Payer: Self-pay | Admitting: Family Medicine

## 2017-06-29 NOTE — Telephone Encounter (Signed)
Last seen 08/05/16

## 2017-06-29 NOTE — Telephone Encounter (Signed)
Ok times one with one ref, will need appt at [facility]

## 2017-07-01 ENCOUNTER — Other Ambulatory Visit: Payer: Self-pay | Admitting: Family Medicine

## 2017-07-01 NOTE — Telephone Encounter (Signed)
Last seen 08/05/16

## 2017-08-03 ENCOUNTER — Ambulatory Visit (INDEPENDENT_AMBULATORY_CARE_PROVIDER_SITE_OTHER): Payer: BLUE CROSS/BLUE SHIELD | Admitting: Family Medicine

## 2017-08-03 ENCOUNTER — Encounter: Payer: Self-pay | Admitting: Family Medicine

## 2017-08-03 VITALS — BP 120/86 | Ht 72.0 in | Wt 211.5 lb

## 2017-08-03 DIAGNOSIS — F5101 Primary insomnia: Secondary | ICD-10-CM | POA: Diagnosis not present

## 2017-08-03 DIAGNOSIS — F411 Generalized anxiety disorder: Secondary | ICD-10-CM | POA: Insufficient documentation

## 2017-08-03 MED ORDER — ALPRAZOLAM 1 MG PO TABS: 1.0000 mg | ORAL_TABLET | Freq: Every evening | ORAL | 5 refills | Status: DC | PRN

## 2017-08-03 MED ORDER — CHOLESTYRAMINE 4 GM/DOSE PO POWD: ORAL | 5 refills | Status: DC

## 2017-08-03 MED ORDER — ESCITALOPRAM OXALATE 10 MG PO TABS: 10.0000 mg | ORAL_TABLET | Freq: Every day | ORAL | 5 refills | Status: DC

## 2017-08-03 MED ORDER — ALBUTEROL SULFATE HFA 108 (90 BASE) MCG/ACT IN AERS: INHALATION_SPRAY | RESPIRATORY_TRACT | 5 refills | Status: DC

## 2017-08-03 NOTE — Progress Notes (Signed)
   Subjective:    Patient ID: Dakota Anthony., male    DOB: [birth year], 51 y.o.   MRN: [id number]  Anxiety  Presents for follow-up visit.     Patient states no other concerns this visit.  Patient compliant with insomnia medication. Generally takes most nights. No obvious morning drowsiness. Definitely helps patient sleep. Without it patient states would not get a good nights rest.   Patient arrives office for substantial discussion. Ongoing challenges with anxiety. This patient a lot of trouble sleeping. Also finds himself irritable a lot. Xanax helps some but not a lot. Symptoms are daily and unrelenting. No suicidal thoughts  States he feels he needs to take something in addition to the Xanax. Review of Systems No headache, no major weight loss or weight gain, no chest pain no back pain abdominal pain no change in bowel habits complete ROS otherwise negative     Objective:   Physical Exam Alert and oriented, vitals reviewed and stable, NAD ENT-TM's and ext canals WNL bilat via otoscopic exam Soft palate, tonsils and post pharynx WNL via oropharyngeal exam Neck-symmetric, no masses; thyroid nonpalpable and nontender Pulmonary-no tachypnea or accessory muscle use; Clear without wheezes via auscultation Card--no abnrml murmurs, rhythm reg and rate WNL Carotid pulses symmetric, without bruits        Assessment & Plan:  Impression generalized anxiety disorder. Long discussion held. Time for preventive agent. Rationale discussed. Importance of compliance discussed. Multiple questions answered.  #2 insomnia discussed. Will maintain Xanax.  Greater than 50% of this 25 minute face to face visit was spent in counseling and discussion and coordination of care regarding the above diagnosis/diagnosies

## 2017-08-31 ENCOUNTER — Telehealth: Payer: Self-pay | Admitting: Family Medicine

## 2017-08-31 NOTE — Telephone Encounter (Signed)
[facility] called stating that patient got refill on 10/15 at [facility]. Patient has also been getting refills at [facility] on 07/30/17, 08/27/17 and now has called [facility] for a refill. Pharmacy stated that she saw it in the [facility]. Pharmacy would like a call back as to can they fill it or when. Called [facility] and patient has been filling it there also.Filled at [facility] on 11/08 requesting from [facility] today

## 2017-08-31 NOTE — Telephone Encounter (Signed)
Left message return call 08/31/17

## 2017-08-31 NOTE — Telephone Encounter (Signed)
[facility] should not fill  Call pt and ask his explanation of getting these filled at tow different places so close together

## 2017-09-21 NOTE — Telephone Encounter (Signed)
I called and left a vm again,asked that he r/c.

## 2017-09-30 NOTE — Telephone Encounter (Signed)
Green card mailed. 09/29/2017.CS

## 2017-10-02 NOTE — Telephone Encounter (Signed)
Pt not responding to our call or letters. Please advise.

## 2017-11-09 NOTE — Telephone Encounter (Signed)
Pt did overlap rx's between two pharm and received excessive rs's has not done since,. Will disc with him at next o v. May file

## 2017-11-13 ENCOUNTER — Other Ambulatory Visit: Payer: Self-pay | Admitting: *Deleted

## 2017-11-23 NOTE — Telephone Encounter (Signed)
Pt never responded--see prior messages re why he got rx's from two different pharmaicies over two months (hasn't done since), needs o v to disc tis before further rx's

## 2017-12-16 ENCOUNTER — Encounter: Payer: Self-pay | Admitting: Family Medicine

## 2017-12-16 ENCOUNTER — Ambulatory Visit: Payer: BLUE CROSS/BLUE SHIELD | Admitting: Family Medicine

## 2017-12-16 VITALS — BP 122/90 | Temp 97.9°F | Ht 72.0 in | Wt 212.0 lb

## 2017-12-16 DIAGNOSIS — J019 Acute sinusitis, unspecified: Secondary | ICD-10-CM

## 2017-12-16 DIAGNOSIS — B9689 Other specified bacterial agents as the cause of diseases classified elsewhere: Secondary | ICD-10-CM | POA: Diagnosis not present

## 2017-12-16 DIAGNOSIS — R062 Wheezing: Secondary | ICD-10-CM | POA: Diagnosis not present

## 2017-12-16 MED ORDER — KETOCONAZOLE 2 % EX CREA: 1.0000 "application " | TOPICAL_CREAM | Freq: Two times a day (BID) | CUTANEOUS | 4 refills | Status: DC

## 2017-12-16 MED ORDER — AMOXICILLIN-POT CLAVULANATE 875-125 MG PO TABS: 1.0000 | ORAL_TABLET | Freq: Two times a day (BID) | ORAL | 0 refills | Status: DC

## 2017-12-16 MED ORDER — ALBUTEROL SULFATE HFA 108 (90 BASE) MCG/ACT IN AERS: INHALATION_SPRAY | RESPIRATORY_TRACT | 5 refills | Status: DC

## 2017-12-16 NOTE — Progress Notes (Signed)
   Subjective:    Patient ID: Dakota Anthony., male    DOB: June 01, 1966, 52 y.o.   MRN: [id number]  Sinusitis  This is a new problem. The current episode started in the past 7 days. Associated symptoms include congestion, coughing, headaches and a sore throat. Pertinent negatives include no chills or ear pain. (Fever, wheezing)  Patient also relates a rash on his lower legs been present over the past few weeks has some what appears to be rounded areas that itch a little bit.    Review of Systems  Constitutional: Negative for activity change, chills and fever.  HENT: Positive for congestion, rhinorrhea and sore throat. Negative for ear pain.   Eyes: Negative for discharge.  Respiratory: Positive for cough. Negative for wheezing.   Cardiovascular: Negative for chest pain.  Gastrointestinal: Negative for nausea and vomiting.  Musculoskeletal: Negative for arthralgias.  Neurological: Positive for headaches.       Objective:   Physical Exam  Constitutional: He appears well-developed.  HENT:  Head: Normocephalic and atraumatic.  Mouth/Throat: Oropharynx is clear and moist. No oropharyngeal exudate.  Eyes: Right eye exhibits no discharge. Left eye exhibits no discharge.  Neck: Normal range of motion.  Cardiovascular: Normal rate, regular rhythm and normal heart sounds.  No murmur heard. Pulmonary/Chest: Effort normal and breath sounds normal. No respiratory distress. He has no wheezes. He has no rales.  Lymphadenopathy:    He has no cervical adenopathy.  Neurological: He exhibits normal muscle tone.  Skin: Skin is warm and dry.  Nursing note and vitals reviewed.  The rash irregular shape patches on the lower legs not extensive possible tinea does not appear to be erythema multiforme       Assessment & Plan:  Nummular eczema versus tinea-trying Nizoral cream follow-up with Dr. Richardson Landry if ongoing  Patient states he will set up an appointment with Dr. Richardson Landry because of left  shoulder pain  Horrible sinus infection Patient was seen today for upper respiratory illness. It is felt that the patient is dealing with sinusitis. Antibiotics were prescribed today. Importance of compliance with medication was discussed. Symptoms should gradually resolve over the course of the next several days. If high fevers, progressive illness, difficulty breathing, worsening condition or failure for symptoms to improve over the next several days then the patient is to follow-up. If any emergent conditions the patient is to follow-up in the emergency department otherwise to follow-up in the office. Patient does have intermittent wheezing but none heard on today's exam he does have albuterol he will let us know if any significant issues

## 2017-12-25 ENCOUNTER — Ambulatory Visit (INDEPENDENT_AMBULATORY_CARE_PROVIDER_SITE_OTHER): Payer: BLUE CROSS/BLUE SHIELD | Admitting: Nurse Practitioner

## 2017-12-25 ENCOUNTER — Encounter: Payer: Self-pay | Admitting: Nurse Practitioner

## 2017-12-25 VITALS — BP 116/82 | Ht 72.0 in | Wt 210.0 lb

## 2017-12-25 DIAGNOSIS — D692 Other nonthrombocytopenic purpura: Secondary | ICD-10-CM | POA: Diagnosis not present

## 2017-12-25 DIAGNOSIS — M25512 Pain in left shoulder: Secondary | ICD-10-CM

## 2017-12-25 DIAGNOSIS — G8929 Other chronic pain: Secondary | ICD-10-CM | POA: Diagnosis not present

## 2017-12-25 MED ORDER — MELOXICAM 15 MG PO TABS: 15.0000 mg | ORAL_TABLET | Freq: Every day | ORAL | 0 refills | Status: DC

## 2017-12-25 NOTE — Patient Instructions (Signed)
Ice or heat applications Biofreeze Lidocaine patch

## 2017-12-26 ENCOUNTER — Encounter: Payer: Self-pay | Admitting: Nurse Practitioner

## 2017-12-26 NOTE — Progress Notes (Signed)
Subjective:  Presents for c/o left shoulder pain for about a year. Occurs off/on. No specific history of injury. Has a job requiring repetitive lifting, up to 50 lbs at a time. Minimal weakness. No neck pain. Cannot sleep on that side due to discomfort. Also c/o a "rash" on his lower legs. Non tender. Non pruritic. Has been there about a month. No other rash. No fever, tick bites or joint pain. No nosebleeds. No bleeding from the gums. No bruising. Is still wearing the same pair of boots. Takes daily low dose ASA but no OTC meds or supplements.   Objective:   BP 116/82   Ht 6' (1.829 m)   Wt 210 lb (95.3 kg)   BMI 28.48 kg/m  NAD. Alert, oriented. Lungs clear. Heart RRR. Normal ROM of the neck. Can perform full ROM of the left shoulder. Hand and arm strength 5+ bilaterally. Minimal tenderness with palpation of the joint. Radial pulses strong.  Several well defined faint ecchymotic lesions noted in various sizes and healing in the lower extremity and ankle area bilaterally. Non palpable and non blanchable. Non tender no palpation. No edema or warmth.   Assessment:  Chronic left shoulder pain  Purpura ([place]) most likely related to trauma from footwear    Plan:   Meds ordered this encounter  Medications  . meloxicam (MOBIC) 15 MG tablet    Sig: Take 1 tablet (15 mg total) by mouth daily.    Dispense:  30 tablet    Refill:  0    Order Specific Question:   Supervising Provider    Answer:   Mikey Kirschner [[id number]]   Ice or heat applications Biofreeze Lidocaine patch Given a copy of shoulder exercises.  Call back in 2 weeks if no improvement, recommend referral to orthopedic at that time.  Labs ordered. Warning signs reviewed. Further follow up based on results. Call sooner if any problems.  25 minutes was spent with the patient.  This statement verifies that 25 minutes was indeed spent with the patient. Greater than half the time was spent in discussion, counseling and answering questions   regarding the issues that the patient came in for today as reflected in the diagnosis (s) please refer to documentation for further details.

## 2017-12-30 ENCOUNTER — Telehealth: Payer: Self-pay | Admitting: *Deleted

## 2017-12-30 ENCOUNTER — Other Ambulatory Visit: Payer: Self-pay | Admitting: Nurse Practitioner

## 2017-12-30 NOTE — Telephone Encounter (Signed)
Is he taking Lexapro (escitalopram) 10 mg? If so, recommend increasing to 2 pills per day (20 mg). Give this 3-4 weeks and call back if no improvement in depression. Call back sooner if he has problems with new dosage. Let me know if he has stopped Lexapro. Thanks.

## 2017-12-30 NOTE — Telephone Encounter (Signed)
Discussed with pt. Pt states he will do bloodwork this week. And he would like to try a medication for depression. Would like med sent to [place] [facility] and a call back after it is sent in so he knows. He gets off at 3:30 and would like a call after that

## 2017-12-30 NOTE — Telephone Encounter (Signed)
-----   Message from Nilda Simmer, NP sent at 12/26/2017  3:01 PM EST ----- Please contact patient and let him know labs were ordered. Try to get them done early this week.  Also, I did not see his depression screen until after visit. Please see if he needs any intervention such as medication at this time. Thanks.

## 2017-12-30 NOTE — Telephone Encounter (Signed)
Left message to return call.         Please contact patient and let him know labs were ordered. Try to get them done early this week.  Also, I did not see his depression screen until after visit. Please see if he needs any intervention such as medication at this time. Thanks.

## 2017-12-30 NOTE — Telephone Encounter (Signed)
Left message to return call 

## 2018-01-01 NOTE — Telephone Encounter (Signed)
Discussed with pt. He is taking lexapro 10mg . He understands to double up and take 20mg  and to call back in 3 -4 weeks with an update and sooner if having issues.

## 2018-01-06 DIAGNOSIS — D692 Other nonthrombocytopenic purpura: Secondary | ICD-10-CM | POA: Diagnosis not present

## 2018-01-07 LAB — BASIC METABOLIC PANEL
BUN/Creatinine Ratio: 12 (ref 9–20)
BUN: 13 mg/dL (ref 6–24)
CALCIUM: 9.8 mg/dL (ref 8.7–10.2)
CHLORIDE: 103 mmol/L (ref 96–106)
CO2: 25 mmol/L (ref 20–29)
Creatinine, Ser: 1.12 mg/dL (ref 0.76–1.27)
GFR calc non Af Amer: 75 mL/min/{1.73_m2} (ref >59)
GFR, EST AFRICAN AMERICAN: 86 mL/min/{1.73_m2} (ref >59)
GLUCOSE: 160 mg/dL — AB (ref 65–99)
POTASSIUM: 4.8 mmol/L (ref 3.5–5.2)
Sodium: 139 mmol/L (ref 134–144)

## 2018-01-07 LAB — CBC WITH DIFFERENTIAL/PLATELET
BASOS ABS: 0.1 10*3/uL (ref 0.0–0.2)
Basos: 2 %
EOS (ABSOLUTE): 0.9 10*3/uL — ABNORMAL HIGH (ref 0.0–0.4)
EOS: 13 %
HEMOGLOBIN: 16.7 g/dL (ref 13.0–17.7)
Hematocrit: 49.2 % (ref 37.5–51.0)
IMMATURE GRANS (ABS): 0 10*3/uL (ref 0.0–0.1)
Immature Granulocytes: 0 %
Lymphocytes Absolute: 2 10*3/uL (ref 0.7–3.1)
Lymphs: 29 %
MCH: 31.6 pg (ref 26.6–33.0)
MCHC: 33.9 g/dL (ref 31.5–35.7)
MCV: 93 fL (ref 79–97)
MONOCYTES: 7 %
Monocytes Absolute: 0.5 10*3/uL (ref 0.1–0.9)
NEUTROS ABS: 3.4 10*3/uL (ref 1.4–7.0)
Neutrophils: 49 %
Platelets: 296 10*3/uL (ref 150–379)
RBC: 5.29 x10E6/uL (ref 4.14–5.80)
RDW: 13.5 % (ref 12.3–15.4)
WBC: 6.9 10*3/uL (ref 3.4–10.8)

## 2018-01-07 LAB — HEPATIC FUNCTION PANEL
ALBUMIN: 4.4 g/dL (ref 3.5–5.5)
ALT: 53 IU/L — AB (ref 0–44)
AST: 31 IU/L (ref 0–40)
Alkaline Phosphatase: 83 IU/L (ref 39–117)
BILIRUBIN TOTAL: 0.5 mg/dL (ref 0.0–1.2)
BILIRUBIN, DIRECT: 0.16 mg/dL (ref 0.00–0.40)
TOTAL PROTEIN: 7.3 g/dL (ref 6.0–8.5)

## 2018-02-09 ENCOUNTER — Other Ambulatory Visit: Payer: Self-pay | Admitting: Family Medicine

## 2018-02-09 ENCOUNTER — Telehealth: Payer: Self-pay | Admitting: Family Medicine

## 2018-02-09 ENCOUNTER — Other Ambulatory Visit: Payer: Self-pay | Admitting: *Deleted

## 2018-02-09 MED ORDER — ALPRAZOLAM 1 MG PO TABS: 1.0000 mg | ORAL_TABLET | Freq: Every evening | ORAL | 0 refills | Status: DC | PRN

## 2018-02-09 NOTE — Telephone Encounter (Signed)
Already done

## 2018-02-09 NOTE — Telephone Encounter (Signed)
One mo worth, needs o v

## 2018-02-09 NOTE — Telephone Encounter (Signed)
Await signature then will fax and call pt

## 2018-02-09 NOTE — Telephone Encounter (Signed)
Script faxed to pharm. Left message to return call to notify pt. Pt already has appoint end of April scheduled.

## 2018-02-09 NOTE — Telephone Encounter (Signed)
Patient is aware 

## 2018-02-09 NOTE — Telephone Encounter (Signed)
Last seen for anxiety oct 2018

## 2018-02-09 NOTE — Telephone Encounter (Signed)
Patient is requesting a refill on his alprazolam 1 mg to [facility].  He is completely out.

## 2018-02-11 ENCOUNTER — Encounter: Payer: Self-pay | Admitting: Family Medicine

## 2018-02-15 ENCOUNTER — Ambulatory Visit (INDEPENDENT_AMBULATORY_CARE_PROVIDER_SITE_OTHER): Payer: BLUE CROSS/BLUE SHIELD | Admitting: Family Medicine

## 2018-02-15 ENCOUNTER — Encounter: Payer: Self-pay | Admitting: Family Medicine

## 2018-02-15 VITALS — BP 122/90 | Ht 71.5 in | Wt 210.0 lb

## 2018-02-15 DIAGNOSIS — G629 Polyneuropathy, unspecified: Secondary | ICD-10-CM | POA: Diagnosis not present

## 2018-02-15 DIAGNOSIS — R21 Rash and other nonspecific skin eruption: Secondary | ICD-10-CM

## 2018-02-15 DIAGNOSIS — R0600 Dyspnea, unspecified: Secondary | ICD-10-CM

## 2018-02-15 DIAGNOSIS — Z0001 Encounter for general adult medical examination with abnormal findings: Secondary | ICD-10-CM

## 2018-02-15 DIAGNOSIS — F411 Generalized anxiety disorder: Secondary | ICD-10-CM

## 2018-02-15 DIAGNOSIS — R739 Hyperglycemia, unspecified: Secondary | ICD-10-CM | POA: Diagnosis not present

## 2018-02-15 DIAGNOSIS — Z125 Encounter for screening for malignant neoplasm of prostate: Secondary | ICD-10-CM

## 2018-02-15 DIAGNOSIS — Z0000 Encounter for general adult medical examination without abnormal findings: Secondary | ICD-10-CM

## 2018-02-15 LAB — POCT GLYCOSYLATED HEMOGLOBIN (HGB A1C): Hemoglobin A1C: 5.4

## 2018-02-15 MED ORDER — ALPRAZOLAM 1 MG PO TABS: 1.0000 mg | ORAL_TABLET | Freq: Every evening | ORAL | 5 refills | Status: DC | PRN

## 2018-02-15 MED ORDER — ALBUTEROL SULFATE HFA 108 (90 BASE) MCG/ACT IN AERS: INHALATION_SPRAY | RESPIRATORY_TRACT | 5 refills | Status: DC

## 2018-02-15 MED ORDER — ESCITALOPRAM OXALATE 10 MG PO TABS: 10.0000 mg | ORAL_TABLET | Freq: Every day | ORAL | 5 refills | Status: DC

## 2018-02-15 NOTE — Progress Notes (Signed)
Subjective:    Patient ID: Dakota Anthony., male    DOB: [birth year], 52 y.o.   MRN: [id number]  HPI The patient comes in today for a wellness visit.    A review of their health history was completed.  A review of medications was also completed.  Any needed refills; proair inhaler, uses on occasion maybe couple times per week     Eating habits: health conscious  Falls/  MVA accidents in past few months: none  Regular exercise: climbs steps at work  Specialist pt sees on regular basis: none  Preventative health issues were discussed.   Additional concerns: spots on leg, results of breathing test done at work.   Results for orders placed or performed in visit on 02/15/18  POCT glycosylated hemoglobin (Hb A1C)  Result Value Ref Range   Hemoglobin A1C 5.4    Results for orders placed or performed in visit on 02/15/18  POCT glycosylated hemoglobin (Hb A1C)  Result Value Ref Range   Hemoglobin A1C 5.4    Recent Results (from the past 2160 hour(s))  Basic metabolic panel     Status: Abnormal   Collection Time: 01/06/18 11:41 AM  Result Value Ref Range   Glucose 160 (H) 65 - 99 mg/dL   BUN 13 6 - 24 mg/dL   Creatinine, Ser 1.12 0.76 - 1.27 mg/dL   GFR calc non Af Amer 75 >59 mL/min/1.73   GFR calc Af Amer 86 >59 mL/min/1.73   BUN/Creatinine Ratio 12 9 - 20   Sodium 139 134 - 144 mmol/L   Potassium 4.8 3.5 - 5.2 mmol/L   Chloride 103 96 - 106 mmol/L   CO2 25 20 - 29 mmol/L   Calcium 9.8 8.7 - 10.2 mg/dL  Hepatic function panel     Status: Abnormal   Collection Time: 01/06/18 11:41 AM  Result Value Ref Range   Total Protein 7.3 6.0 - 8.5 g/dL   Albumin 4.4 3.5 - 5.5 g/dL   Bilirubin Total 0.5 0.0 - 1.2 mg/dL   Bilirubin, Direct 0.16 0.00 - 0.40 mg/dL   Alkaline Phosphatase 83 39 - 117 IU/L   AST 31 0 - 40 IU/L   ALT 53 (H) 0 - 44 IU/L  CBC with Differential/Platelet     Status: Abnormal   Collection Time: 01/06/18 11:41 AM  Result Value Ref Range   WBC 6.9  3.4 - 10.8 x10E3/uL   RBC 5.29 4.14 - 5.80 x10E6/uL   Hemoglobin 16.7 13.0 - 17.7 g/dL   Hematocrit 49.2 37.5 - 51.0 %   MCV 93 79 - 97 fL   MCH 31.6 26.6 - 33.0 pg   MCHC 33.9 31.5 - 35.7 g/dL   RDW 13.5 12.3 - 15.4 %   Platelets 296 150 - 379 x10E3/uL   Neutrophils 49 Not Estab. %   Lymphs 29 Not Estab. %   Monocytes 7 Not Estab. %   Eos 13 Not Estab. %   Basos 2 Not Estab. %   Neutrophils Absolute 3.4 1.4 - 7.0 x10E3/uL   Lymphocytes Absolute 2.0 0.7 - 3.1 x10E3/uL   Monocytes Absolute 0.5 0.1 - 0.9 x10E3/uL   EOS (ABSOLUTE) 0.9 (H) 0.0 - 0.4 x10E3/uL   Basophils Absolute 0.1 0.0 - 0.2 x10E3/uL   Immature Granulocytes 0 Not Estab. %   Immature Grans (Abs) 0.0 0.0 - 0.1 x10E3/uL  POCT glycosylated hemoglobin (Hb A1C)     Status: None   Collection Time: 02/15/18  3:59 PM  Result Value Ref Range   Hemoglobin A1C 5.4       Review of Systems  Constitutional: Negative for activity change, appetite change and fever.  HENT: Negative for congestion and rhinorrhea.   Eyes: Negative for discharge.  Respiratory: Negative for cough and wheezing.   Cardiovascular: Negative for chest pain.  Gastrointestinal: Negative for abdominal pain, blood in stool and vomiting.  Genitourinary: Negative for difficulty urinating and frequency.  Musculoskeletal: Negative for neck pain.  Skin: Negative for rash.  Allergic/Immunologic: Negative for environmental allergies and food allergies.  Neurological: Negative for weakness and headaches.  Psychiatric/Behavioral: Negative for agitation.  All other systems reviewed and are negative.      Objective:   Physical Exam  Constitutional: He appears well-developed and well-nourished.  HENT:  Head: Normocephalic and atraumatic.  Right Ear: External ear normal.  Left Ear: External ear normal.  Nose: Nose normal.  Mouth/Throat: Oropharynx is clear and moist.  Eyes: Pupils are equal, round, and reactive to light. EOM are normal.  Neck: Normal range  of motion. Neck supple. No thyromegaly present.  Cardiovascular: Normal rate, regular rhythm and normal heart sounds.  No murmur heard. Pulmonary/Chest: Effort normal and breath sounds normal. No respiratory distress. He has no wheezes.  Abdominal: Soft. Bowel sounds are normal. He exhibits no distension and no mass. There is no tenderness.  Genitourinary: Penis normal.  Musculoskeletal: Normal range of motion. He exhibits no edema.  Lymphadenopathy:    He has no cervical adenopathy.  Neurological: He is alert. He exhibits normal muscle tone.  Skin: Skin is warm and dry. No erythema.  Psychiatric: He has a normal mood and affect. His behavior is normal. Judgment normal.  Prostate gland within normal limits  Diminished sensation distally.  Chronic rash noted on the dorsal ankles.        Assessment & Plan:  Impression wellness exam patient up-to-date on colonoscopy.  Diet discussed.  Exercise discussed.  2.  Abnormal pulmonary function test.  Obtained at work.  We will do pre-and post nebulizer treatments.  Patient reports using albuterol once or twice per day in recent months.  Her graph #3 rash uncertain etiology discussed we will get dermatology referral  4.  Diminished peripheral sensation.  5.  We will do appropriate blood work in this regard.  Recheck in several weeks for follow-up and pulmonary function test and blood work and further discussion  Chronic anxiety medications also refilled  Also glucose was up and patient admits to drinking sweet tea and A1c is fine

## 2018-02-15 NOTE — Progress Notes (Signed)
8

## 2018-02-16 LAB — FOLATE: FOLATE: 8.9 ng/mL (ref >3.0)

## 2018-02-16 LAB — PSA: PROSTATE SPECIFIC AG, SERUM: 4 ng/mL (ref 0.0–4.0)

## 2018-02-16 LAB — VITAMIN B12: VITAMIN B 12: 1011 pg/mL (ref 232–1245)

## 2018-03-03 ENCOUNTER — Encounter: Payer: Self-pay | Admitting: Family Medicine

## 2018-03-05 ENCOUNTER — Other Ambulatory Visit: Payer: Self-pay | Admitting: Nurse Practitioner

## 2018-04-28 ENCOUNTER — Other Ambulatory Visit: Payer: Self-pay | Admitting: Nurse Practitioner

## 2018-05-17 ENCOUNTER — Telehealth: Payer: Self-pay | Admitting: Family Medicine

## 2018-05-17 NOTE — Telephone Encounter (Signed)
In this particular instance I believe it is best to speak with the actual patient not just the patient friend-unless the friend has a release that is already signed on this patient  Given having to use inhaler more along with chest discomforts and tightness this patient needs to be seen ideally.  It is best to talk with the patient get the story verbatim from the patient then we can go from there

## 2018-05-17 NOTE — Telephone Encounter (Signed)
Pt friend contacted office to inform Dr.Steve that patient needs nebulizer. Pt friend stated that this was talked about at last office visit. Pt friend stated that he is using his inhaler more. Having shortness of breath and tightness in chest. Nurse informed for them to go to ER with these symptoms. Pt friend states that it is from the dust at work and the heat. Advised that if he got worse, that he needs to go to ER. Pt friend stated that they know that.

## 2018-05-17 NOTE — Telephone Encounter (Signed)
Left message to return call 

## 2018-05-18 NOTE — Telephone Encounter (Signed)
Santiago Glad is patients significant other and has a DPR so we can speak with her. Left message to return call to inform pt to set up OV.

## 2018-05-18 NOTE — Telephone Encounter (Signed)
Pt returned call and is setting up OV

## 2018-05-19 ENCOUNTER — Encounter: Payer: Self-pay | Admitting: Family Medicine

## 2018-05-19 ENCOUNTER — Ambulatory Visit (INDEPENDENT_AMBULATORY_CARE_PROVIDER_SITE_OTHER): Payer: BLUE CROSS/BLUE SHIELD | Admitting: Family Medicine

## 2018-05-19 VITALS — BP 132/86 | Ht 71.5 in | Wt 213.2 lb

## 2018-05-19 DIAGNOSIS — R0609 Other forms of dyspnea: Secondary | ICD-10-CM | POA: Diagnosis not present

## 2018-05-19 DIAGNOSIS — R062 Wheezing: Secondary | ICD-10-CM | POA: Diagnosis not present

## 2018-05-19 DIAGNOSIS — Z13220 Encounter for screening for lipoid disorders: Secondary | ICD-10-CM

## 2018-05-19 MED ORDER — ALBUTEROL SULFATE (2.5 MG/3ML) 0.083% IN NEBU: 2.5000 mg | INHALATION_SOLUTION | RESPIRATORY_TRACT | 12 refills | Status: DC | PRN

## 2018-05-19 NOTE — Progress Notes (Signed)
   Subjective:    Patient ID: Dakota Sneddon., male    DOB: 01/25/1966, 52 y.o.   MRN: [id number]  HPI Patient arrives for an asthma flare- has been wheezing a lot lately. Patient states he has been having to use his inhaler several times a day and has noticed his grandsons neb machine does better and would like a prescription for neb machine. This patient has asthma This is come on him in the recent years He denies smoking He denies being around any smoke He is around some fumes at work He is in too hot environments as well as cold environment with his work He does describe at times wheezing at times a tightness in his chest He denies any chest pressure or pain or discomfort with walking Does not have a recent cholesterol Denies any arm pain or neck pain He states he thinks the nebulizer works better for him he would like to have that Patient never did do his pulmonary function test he states the hospital pulmonary department stated that they did not have a order for it  Review of Systems  Constitutional: Negative for activity change, fatigue and fever.  HENT: Negative for congestion and rhinorrhea.   Respiratory: Positive for shortness of breath and wheezing. Negative for cough.   Cardiovascular: Negative for chest pain and leg swelling.  Gastrointestinal: Negative for abdominal pain, diarrhea and nausea.  Genitourinary: Negative for dysuria and hematuria.  Neurological: Negative for weakness and headaches.  Psychiatric/Behavioral: Negative for agitation and behavioral problems.       Objective:   Physical Exam  Constitutional: He appears well-nourished. No distress.  HENT:  Head: Normocephalic and atraumatic.  Eyes: Right eye exhibits no discharge. Left eye exhibits no discharge.  Neck: No tracheal deviation present.  Cardiovascular: Normal rate, regular rhythm and normal heart sounds.  No murmur heard. Pulmonary/Chest: Effort normal and breath sounds normal. No  respiratory distress.  Musculoskeletal: He exhibits no edema.  Lymphadenopathy:    He has no cervical adenopathy.  Neurological: He is alert. Coordination normal.  Skin: Skin is warm and dry.  Psychiatric: He has a normal mood and affect. His behavior is normal.  Vitals reviewed.  EKG appears normal There is no acute changes compared to the most recent EKG       Assessment & Plan:  DOE Probably related to reactive airway Could be developing COPD I recommend pulmonary function test with pre-and post Follow-up with Dr. Richardson Landry depending on the results of the test Albuterol may be used as a inhaler or nebulizer depending on his home situation Screening lipid profile recommended If ongoing dyspnea on exertion follow-up

## 2018-05-21 ENCOUNTER — Encounter: Payer: Self-pay | Admitting: Family Medicine

## 2018-06-28 ENCOUNTER — Telehealth: Payer: Self-pay | Admitting: Family Medicine

## 2018-06-28 NOTE — Telephone Encounter (Signed)
Last seen 05/19/18 for asthma

## 2018-06-28 NOTE — Telephone Encounter (Signed)
Pt needs refill on meloxicam (MOBIC) 15 MG tablet   [facility]   Please call pt when done

## 2018-06-30 ENCOUNTER — Other Ambulatory Visit: Payer: Self-pay | Admitting: *Deleted

## 2018-06-30 MED ORDER — MELOXICAM 15 MG PO TABS: 15.0000 mg | ORAL_TABLET | Freq: Every day | ORAL | 3 refills | Status: DC

## 2018-06-30 NOTE — Telephone Encounter (Signed)
Ok plus 3 ref 

## 2018-06-30 NOTE — Telephone Encounter (Signed)
Left message to return call to let pt know refills were sent to pharm

## 2018-07-02 NOTE — Telephone Encounter (Signed)
Patient is aware 

## 2018-08-05 ENCOUNTER — Other Ambulatory Visit: Payer: Self-pay | Admitting: Family Medicine

## 2018-08-05 NOTE — Telephone Encounter (Signed)
One mo worth 

## 2018-08-25 ENCOUNTER — Other Ambulatory Visit: Payer: Self-pay | Admitting: Family Medicine

## 2018-09-03 ENCOUNTER — Other Ambulatory Visit: Payer: Self-pay | Admitting: Family Medicine

## 2018-09-07 ENCOUNTER — Telehealth: Payer: Self-pay | Admitting: Family Medicine

## 2018-09-07 ENCOUNTER — Other Ambulatory Visit: Payer: Self-pay | Admitting: *Deleted

## 2018-09-07 MED ORDER — ALPRAZOLAM 1 MG PO TABS: 1.0000 mg | ORAL_TABLET | Freq: Every evening | ORAL | 0 refills | Status: DC | PRN

## 2018-09-07 NOTE — Telephone Encounter (Signed)
Faxed the script.

## 2018-09-07 NOTE — Telephone Encounter (Signed)
Script printed. Await dr signature then will fax and call pt to let him know its ready.

## 2018-09-07 NOTE — Telephone Encounter (Signed)
Left message to return call 

## 2018-09-07 NOTE — Telephone Encounter (Signed)
Patient is checking on xanax 1 mg that was ask for on 11/15, he was last seen 05/19/18 is completely out. [facility]

## 2018-09-07 NOTE — Telephone Encounter (Signed)
One mo worth needs six mo o v

## 2018-10-04 ENCOUNTER — Other Ambulatory Visit: Payer: Self-pay | Admitting: Family Medicine

## 2018-10-05 NOTE — Telephone Encounter (Signed)
Ok plus five monthly ref 

## 2018-10-26 ENCOUNTER — Ambulatory Visit: Payer: BLUE CROSS/BLUE SHIELD | Admitting: Family Medicine

## 2018-11-19 ENCOUNTER — Encounter: Payer: Self-pay | Admitting: Family Medicine

## 2018-11-27 ENCOUNTER — Emergency Department (HOSPITAL_COMMUNITY): Payer: BLUE CROSS/BLUE SHIELD

## 2018-11-27 ENCOUNTER — Encounter (HOSPITAL_COMMUNITY): Payer: Self-pay | Admitting: Emergency Medicine

## 2018-11-27 ENCOUNTER — Emergency Department (HOSPITAL_COMMUNITY)
Admission: EM | Admit: 2018-11-27 | Discharge: 2018-11-27 | Disposition: A | Discharge status: Home / Self Care | Payer: BLUE CROSS/BLUE SHIELD | Attending: Emergency Medicine | Admitting: Emergency Medicine

## 2018-11-27 DIAGNOSIS — E119 Type 2 diabetes mellitus without complications: Secondary | ICD-10-CM | POA: Insufficient documentation

## 2018-11-27 DIAGNOSIS — R509 Fever, unspecified: Secondary | ICD-10-CM | POA: Diagnosis not present

## 2018-11-27 DIAGNOSIS — J45909 Unspecified asthma, uncomplicated: Secondary | ICD-10-CM | POA: Diagnosis not present

## 2018-11-27 DIAGNOSIS — J329 Chronic sinusitis, unspecified: Secondary | ICD-10-CM | POA: Insufficient documentation

## 2018-11-27 DIAGNOSIS — R0981 Nasal congestion: Secondary | ICD-10-CM | POA: Diagnosis not present

## 2018-11-27 DIAGNOSIS — R05 Cough: Secondary | ICD-10-CM | POA: Diagnosis not present

## 2018-11-27 DIAGNOSIS — Z79899 Other long term (current) drug therapy: Secondary | ICD-10-CM | POA: Diagnosis not present

## 2018-11-27 DIAGNOSIS — Z7982 Long term (current) use of aspirin: Secondary | ICD-10-CM | POA: Insufficient documentation

## 2018-11-27 DIAGNOSIS — J069 Acute upper respiratory infection, unspecified: Secondary | ICD-10-CM | POA: Diagnosis not present

## 2018-11-27 MED ORDER — DEXAMETHASONE 4 MG PO TABS: 4.0000 mg | ORAL_TABLET | Freq: Two times a day (BID) | ORAL | 0 refills | Status: DC

## 2018-11-27 MED ORDER — PSEUDOEPHEDRINE HCL 60 MG PO TABS
60.0000 mg | ORAL_TABLET | Freq: Once | ORAL | Status: AC
  Administered 2018-11-27: 60 mg via ORAL
  Filled 2018-11-27: qty 1

## 2018-11-27 MED ORDER — ALBUTEROL SULFATE HFA 108 (90 BASE) MCG/ACT IN AERS
2.0000 | INHALATION_SPRAY | Freq: Once | RESPIRATORY_TRACT | Status: AC
  Administered 2018-11-27: 2 via RESPIRATORY_TRACT
  Filled 2018-11-27: qty 6.7

## 2018-11-27 MED ORDER — LORATADINE-PSEUDOEPHEDRINE ER 5-120 MG PO TB12: 1.0000 | ORAL_TABLET | Freq: Two times a day (BID) | ORAL | 0 refills | Status: DC

## 2018-11-27 MED ORDER — ALBUTEROL SULFATE (2.5 MG/3ML) 0.083% IN NEBU
2.5000 mg | INHALATION_SOLUTION | Freq: Once | RESPIRATORY_TRACT | Status: AC
  Administered 2018-11-27: 2.5 mg via RESPIRATORY_TRACT
  Filled 2018-11-27: qty 3

## 2018-11-27 MED ORDER — PREDNISONE 20 MG PO TABS
40.0000 mg | ORAL_TABLET | Freq: Once | ORAL | Status: AC
  Administered 2018-11-27: 40 mg via ORAL
  Filled 2018-11-27: qty 2

## 2018-11-27 NOTE — ED Triage Notes (Signed)
Pt states he has had nasal congestion for about a week

## 2018-11-27 NOTE — ED Provider Notes (Signed)
[facility] EMERGENCY DEPARTMENT Provider Note   CSN: [id number] Arrival date & time: 11/27/18  0741     History   Chief Complaint Chief Complaint  Patient presents with  . Nasal Congestion    HPI Dakota Anthony. is a 53 y.o. male.  The history is provided by the patient.  URI  Presenting symptoms: congestion, cough, fever and rhinorrhea   Severity:  Moderate Onset quality:  Gradual Duration:  1 week Timing:  Intermittent Progression:  Worsening Chronicity:  New Relieved by:  Nothing Worsened by:  Nothing Ineffective treatments: OTC cold/flu medication, OJ. Associated symptoms: no arthralgias, no headaches, no myalgias, no neck pain, no sneezing and no wheezing   Risk factors: no recent travel and no sick contacts     Past Medical History:  Diagnosis Date  . Anemia    age 22  had to take iron shots  . Anxiety    Possible bipolar disorder  . Arthritis    hands  . Asthma   . Chest discomfort   . Chronic back pain   . Diabetes mellitus without complication (HCC)    hypoglycemia  . GERD (gastroesophageal reflux disease)    tums occassionally  . Left eye injury    Traumatic damage  . Migraine headache   . Shortness of breath dyspnea     Patient Active Problem List   Diagnosis Date Noted  . Generalized anxiety disorder 08/03/2017  . Insomnia 08/08/2016  . Acute appendicitis 01/03/2016  . MIGRAINE HEADACHE 01/17/2010  . CHEST PAIN 01/17/2010  . Anxiety state 01/16/2010    Past Surgical History:  Procedure Laterality Date  . APPENDECTOMY    . CATARACT EXTRACTION     From left eye following injury as a teenager  . CHOLECYSTECTOMY  10/31/2011   Procedure: LAPAROSCOPIC CHOLECYSTECTOMY;  Surgeon: Jamesetta So;  Location: [facility] ORS;  Service: General;  Laterality: N/A;  . COLONOSCOPY N/A 07/17/2016   Procedure: COLONOSCOPY;  Surgeon: Daneil Dolin, MD;  Location: [facility] ENDO SUITE;  Service: Endoscopy;  Laterality: N/A;  1:15 PM  . LAPAROSCOPIC APPENDECTOMY  N/A 01/03/2016   Procedure: APPENDECTOMY LAPAROSCOPIC;  Surgeon: Aviva Signs, MD;  Location: [facility] ORS;  Service: General;  Laterality: N/A;        Home Medications    Prior to Admission medications   Medication Sig Start Date End Date Taking? Authorizing Provider  albuterol (PROAIR HFA) 108 (90 Base) MCG/ACT inhaler INHALE 2 PUFFS INTO THE LUNGS EVERY SIX HOURS AS NEEDED FOR WHEEZING OR SHORTNESS OF BREATH. 10/05/18   Mikey Kirschner, MD  albuterol (PROVENTIL) (2.5 MG/3ML) 0.083% nebulizer solution Take 3 mLs (2.5 mg total) by nebulization every 4 (four) hours as needed for wheezing. 05/19/18   Kathyrn Drown, MD  ALPRAZolam Duanne Moron) 1 MG tablet TAKE 1-2 TABLETS BY MOUTH AT BEDTIME AS NEEDED FOR SLEEP. 10/05/18   Mikey Kirschner, MD  aspirin 81 MG tablet Take 81 mg by mouth daily.      [provider]  cholestyramine Lucrezia Starch) 4 GM/DOSE powder Take 1 scoop by mouth daily 08/03/17   Mikey Kirschner, MD  escitalopram (LEXAPRO) 10 MG tablet Take 1 tablet (10 mg total) by mouth daily. 02/15/18   Mikey Kirschner, MD  ketoconazole (NIZORAL) 2 % cream Apply 1 application topically 2 (two) times daily. 12/16/17   Kathyrn Drown, MD  meloxicam (MOBIC) 15 MG tablet Take 1 tablet (15 mg total) by mouth daily. 06/30/18   Mikey Kirschner,  MD    Family History Family History  Problem Relation Age of Onset  . Kidney failure Mother   . Hypertension Mother   . Diabetes Mother   . Cancer Father   . Cirrhosis Father        Hepatic  . Colon cancer Paternal Uncle   . Anesthesia problems Neg Hx   . Hypotension Neg Hx   . Malignant hyperthermia Neg Hx   . Pseudochol deficiency Neg Hx     Social History Social History   Tobacco Use  . Smoking status: Never Smoker  . Smokeless tobacco: Current User    Types: Chew  Substance Use Topics  . Alcohol use: No    Comment: No excessive use  . Drug use: No     Allergies   Patient has no known allergies.   Review of Systems Review  of Systems  Constitutional: Positive for appetite change and fever. Negative for activity change.       All ROS Neg except as noted in HPI  HENT: Positive for congestion and rhinorrhea. Negative for nosebleeds and sneezing.   Eyes: Negative for photophobia and discharge.  Respiratory: Positive for cough. Negative for shortness of breath and wheezing.   Cardiovascular: Negative for chest pain and palpitations.  Gastrointestinal: Negative for abdominal pain and blood in stool.  Genitourinary: Negative for dysuria, frequency and hematuria.  Musculoskeletal: Negative for arthralgias, back pain, myalgias and neck pain.  Skin: Negative.   Neurological: Negative for dizziness, seizures, speech difficulty and headaches.  Psychiatric/Behavioral: Negative for confusion and hallucinations.     Physical Exam Updated Vital Signs BP (!) 146/90 (BP Location: Right Arm)   Pulse 84   Temp 98 F (36.7 C) (Oral)   Resp 18   Ht 6' (1.829 m)   Wt 99.8 kg   SpO2 100%   BMI 29.84 kg/m   Physical Exam Vitals signs and nursing note reviewed.  Constitutional:      Appearance: He is well-developed. He is not toxic-appearing.  HENT:     Head: Normocephalic.     Right Ear: Tympanic membrane and external ear normal.     Left Ear: Tympanic membrane and external ear normal.     Nose:     Comments: Nasal congestion present Eyes:     General: Lids are normal.     Pupils: Pupils are equal, round, and reactive to light.  Neck:     Musculoskeletal: Normal range of motion and neck supple.     Vascular: No carotid bruit.  Cardiovascular:     Rate and Rhythm: Normal rate and regular rhythm.     Pulses: Normal pulses.     Heart sounds: Normal heart sounds.  Pulmonary:     Effort: No respiratory distress.     Breath sounds: Wheezing and rhonchi present.  Abdominal:     General: Bowel sounds are normal.     Palpations: Abdomen is soft.     Tenderness: There is no abdominal tenderness. There is no  guarding.  Musculoskeletal: Normal range of motion.  Lymphadenopathy:     Head:     Right side of head: No submandibular adenopathy.     Left side of head: No submandibular adenopathy.     Cervical: No cervical adenopathy.  Skin:    General: Skin is warm and dry.  Neurological:     Mental Status: He is alert and oriented to person, place, and time.     Cranial Nerves: No cranial nerve deficit.  Sensory: No sensory deficit.  Psychiatric:        Speech: Speech normal.      ED Treatments / Results  Labs (all labs ordered are listed, but only abnormal results are displayed) Labs Reviewed - No data to display  EKG None  Radiology No results found.  Procedures Procedures (including critical care time)  Medications Ordered in ED Medications - No data to display   Initial Impression / Assessment and Plan / ED Course  I have reviewed the triage vital signs and the nursing notes.  Pertinent labs & imaging results that were available during my care of the patient were reviewed by me and considered in my medical decision making (see chart for details).       Final Clinical Impressions(s) / ED Diagnoses MDM  Blood pressure is mildly elevated.  I have asked the patient to have this rechecked soon.  The patient was noted to have some mild wheezing present on examination.  Chest x-ray is negative for acute changes.  The patient is breathing much easier after albuterol treatment.  The patient the patient was treated with Sudafed for his nasal congestion.  Recheck the patient is breathing much easier and feeling better.  We will use Claritin-D, albuterol, and Decadron for this patient.  Have asked him to follow-up with his primary physician.  He will return to the emergency department if any changes in his condition, problems, or concerns.   Final diagnoses:  Sinusitis, unspecified chronicity, unspecified location  Upper respiratory tract infection, unspecified type    ED  Discharge Orders         Ordered    dexamethasone (DECADRON) 4 MG tablet  2 times daily with meals     11/27/18 1117    loratadine-pseudoephedrine (CLARITIN-D 12 HOUR) 5-120 MG tablet  2 times daily,   Status:  Discontinued     11/27/18 1117    loratadine-pseudoephedrine (CLARITIN-D 12 HOUR) 5-120 MG tablet  2 times daily     11/27/18 1118           Dakota Kocher, PA-C 11/27/18 Taylor, Nance, DO 11/29/18 1506

## 2018-11-27 NOTE — Discharge Instructions (Addendum)
Your oxygen level is 98% on room air.  This is within normal limits.  Your chest x-ray is negative for pneumonia or other emergent situations.  Your examination favors sinusitis and upper respiratory infection, with aggravation of your asthma.  Please use albuterol 2 puffs every 4 hours.  Please use Decadron 2 times daily with food.  Use Claritin-D every 12 hours.  May use Tylenol every 4 hours or ibuprofen every 6 hours for fever and or aching.  Please see Dr. Wolfgang Phoenix in the office for additional evaluation and management if not improving.

## 2018-11-27 NOTE — Progress Notes (Signed)
**Note De-Identified Dakota Anthony Obfuscation** Patient educated with [place] for proper MDI medication delivery.

## 2019-01-06 ENCOUNTER — Other Ambulatory Visit: Payer: Self-pay | Admitting: Family Medicine

## 2019-03-23 ENCOUNTER — Encounter: Payer: Self-pay | Admitting: Family Medicine

## 2019-03-23 ENCOUNTER — Other Ambulatory Visit: Payer: Self-pay

## 2019-03-23 ENCOUNTER — Telehealth: Payer: Self-pay

## 2019-03-23 ENCOUNTER — Ambulatory Visit (INDEPENDENT_AMBULATORY_CARE_PROVIDER_SITE_OTHER): Payer: BC Managed Care – PPO | Admitting: Family Medicine

## 2019-03-23 VITALS — BP 130/82 | Temp 97.9°F | Ht 71.5 in | Wt 213.0 lb

## 2019-03-23 DIAGNOSIS — R5383 Other fatigue: Secondary | ICD-10-CM

## 2019-03-23 DIAGNOSIS — G629 Polyneuropathy, unspecified: Secondary | ICD-10-CM

## 2019-03-23 DIAGNOSIS — Z79899 Other long term (current) drug therapy: Secondary | ICD-10-CM

## 2019-03-23 DIAGNOSIS — Z125 Encounter for screening for malignant neoplasm of prostate: Secondary | ICD-10-CM | POA: Diagnosis not present

## 2019-03-23 DIAGNOSIS — R739 Hyperglycemia, unspecified: Secondary | ICD-10-CM | POA: Diagnosis not present

## 2019-03-23 DIAGNOSIS — Z13220 Encounter for screening for lipoid disorders: Secondary | ICD-10-CM

## 2019-03-23 MED ORDER — ALPRAZOLAM 1 MG PO TABS: 1.0000 mg | ORAL_TABLET | Freq: Every evening | ORAL | 5 refills | Status: DC | PRN

## 2019-03-23 MED ORDER — CHOLESTYRAMINE 4 GM/DOSE PO POWD: ORAL | 12 refills | Status: AC

## 2019-03-23 NOTE — Progress Notes (Signed)
   Subjective:    Patient ID: Dakota Anthony., male    DOB: [birth year], 53 y.o.   MRN: [id number] In person visit HPI  Patient arrives to discuss a change in his bowel habits. Patient states he has cut out soda and went to coffee and water and now goes to the bathroom 5 time a day. Patient states his stools are loose at times. Patient also states he still has the spots o his leg and the cream he was given did not help them go away.  Pt working and cleaning and beding and squatting  pt has frustrating persistent rash   Uses caffeine Pt someties when squatting, loses a bit of stool , and sometime shas to go several times per day   No sig change in diets drank sodas, then changed to water   Has an accident maybe once vevry rare now and then    No otc meds    Patient compliant with insomnia medication. Generally takes most nights. No obvious morning drowsiness. Definitely helps patient sleep. Without it patient states would not get a good nights rest.    Patient has chronic aggravating rash.  Is tried steroid creams on it without avail  Notes ongoing anxiety.  Fairly considerable.                                                                                  Review of Systems No headache, no major weight loss or weight gain, no chest pain no back pain abdominal pain no change in bowel habits complete ROS otherwise negative     Objective:   Physical Exam  Alert and oriented, vitals reviewed and stable, NAD ENT-TM's and ext canals WNL bilat via otoscopic exam Soft palate, tonsils and post pharynx WNL via oropharyngeal exam Neck-symmetric, no masses; thyroid nonpalpable and nontender Pulmonary-no tachypnea or accessory muscle use; Clear without wheezes via auscultation Card--no abnrml murmurs, rhythm reg and rate WNL Carotid pulses symmetric, without bruits Abdominal exam completely benignLegs  macular hyperpigmented rash      Assessment & Plan:  Impression chronic insomnia/anxiety.  Uses Xanax at night.  Handles well.  Definitely helps will refill and maintain  2.  Chronic diarrhea.  Particularly since cholecystectomy.  Discussed and educated.  Questran prescribed  3.  Macular hyperpigmented rash.  Etiology unclear.  Does not appear like any cancer.  Discussed with patient.  Dermatology referral offered patient to hold off for now  4.  Fatigue.  Patient's not had any blood work recently.  Appropriate blood work exercise encouraged

## 2019-04-17 ENCOUNTER — Encounter: Payer: Self-pay | Admitting: Family Medicine

## 2019-05-30 ENCOUNTER — Other Ambulatory Visit: Payer: Self-pay | Admitting: Family Medicine

## 2019-06-16 ENCOUNTER — Other Ambulatory Visit: Payer: Self-pay | Admitting: Family Medicine

## 2019-08-11 ENCOUNTER — Ambulatory Visit: Payer: BC Managed Care – PPO | Admitting: Family Medicine

## 2019-08-26 ENCOUNTER — Other Ambulatory Visit: Payer: Self-pay | Admitting: Family Medicine

## 2019-09-05 ENCOUNTER — Other Ambulatory Visit: Payer: Self-pay | Admitting: Family Medicine

## 2019-09-05 NOTE — Telephone Encounter (Signed)
Three mo worth 

## 2019-09-06 ENCOUNTER — Telehealth: Payer: Self-pay | Admitting: Family Medicine

## 2019-09-06 NOTE — Telephone Encounter (Signed)
Prescription was faxed to pharmacy. Patient to check with pharmacy this afternoon.

## 2019-09-06 NOTE — Telephone Encounter (Signed)
Patient checking on Xanax refill.  I told him it looks like it is being done and to check back with the pharmacy later today.

## 2019-09-14 ENCOUNTER — Other Ambulatory Visit: Payer: Self-pay | Admitting: Family Medicine

## 2019-09-14 NOTE — Telephone Encounter (Signed)
Last seen for asthma July 2019

## 2019-10-29 ENCOUNTER — Other Ambulatory Visit: Payer: Self-pay | Admitting: Family Medicine

## 2019-11-01 ENCOUNTER — Other Ambulatory Visit: Payer: Self-pay | Admitting: Family Medicine

## 2019-11-01 NOTE — Telephone Encounter (Signed)
Ok three mo 

## 2019-11-21 ENCOUNTER — Other Ambulatory Visit: Payer: Self-pay | Admitting: Family Medicine

## 2019-11-22 NOTE — Telephone Encounter (Signed)
Last seen for asthma 05/19/18

## 2019-11-23 ENCOUNTER — Encounter: Payer: Self-pay | Admitting: Family Medicine

## 2019-11-30 ENCOUNTER — Other Ambulatory Visit: Payer: Self-pay | Admitting: *Deleted

## 2019-11-30 ENCOUNTER — Telehealth: Payer: Self-pay | Admitting: Family Medicine

## 2019-11-30 MED ORDER — ALBUTEROL SULFATE HFA 108 (90 BASE) MCG/ACT IN AERS: INHALATION_SPRAY | RESPIRATORY_TRACT | 5 refills | Status: DC

## 2019-11-30 MED ORDER — ALPRAZOLAM 1 MG PO TABS: 1.0000 mg | ORAL_TABLET | Freq: Every evening | ORAL | 5 refills | Status: DC | PRN

## 2019-11-30 NOTE — Telephone Encounter (Signed)
Six mo ok uses just hs

## 2019-11-30 NOTE — Telephone Encounter (Signed)
Tcna on number on the message, left message on pt's mobile number to return call to let him know refills have been sent in.

## 2019-11-30 NOTE — Telephone Encounter (Signed)
Refill on inhaler sent and refill for xanax printed and ready for dr Richardson Landry to sign then will fax and call pt

## 2019-11-30 NOTE — Telephone Encounter (Signed)
Patient is requesting refill albuterol inhaler and xanax 1mg  call into [place]

## 2019-12-12 NOTE — Telephone Encounter (Signed)
Left message to return call 

## 2019-12-13 NOTE — Telephone Encounter (Signed)
Unable to get in touch with pt. Called pharm and he picked up meds.

## 2020-02-28 ENCOUNTER — Other Ambulatory Visit: Payer: Self-pay | Admitting: Family Medicine

## 2020-03-30 ENCOUNTER — Ambulatory Visit: Payer: BC Managed Care – PPO | Admitting: Family Medicine

## 2020-04-03 ENCOUNTER — Encounter: Payer: Self-pay | Admitting: Family Medicine

## 2020-05-31 ENCOUNTER — Ambulatory Visit (HOSPITAL_COMMUNITY)
Admission: RE | Admit: 2020-05-31 | Discharge: 2020-05-31 | Disposition: A | Discharge status: Home / Self Care | Payer: BC Managed Care – PPO | Source: Ambulatory Visit | Attending: Family Medicine | Admitting: Family Medicine

## 2020-05-31 ENCOUNTER — Ambulatory Visit (INDEPENDENT_AMBULATORY_CARE_PROVIDER_SITE_OTHER): Payer: BC Managed Care – PPO | Admitting: Family Medicine

## 2020-05-31 ENCOUNTER — Other Ambulatory Visit: Payer: Self-pay

## 2020-05-31 ENCOUNTER — Encounter: Payer: Self-pay | Admitting: Family Medicine

## 2020-05-31 VITALS — BP 122/84 | HR 76 | Temp 97.6°F | Ht 71.5 in | Wt 222.6 lb

## 2020-05-31 DIAGNOSIS — R5383 Other fatigue: Secondary | ICD-10-CM

## 2020-05-31 DIAGNOSIS — M25512 Pain in left shoulder: Secondary | ICD-10-CM | POA: Insufficient documentation

## 2020-05-31 DIAGNOSIS — Z79899 Other long term (current) drug therapy: Secondary | ICD-10-CM

## 2020-05-31 DIAGNOSIS — G8929 Other chronic pain: Secondary | ICD-10-CM

## 2020-05-31 DIAGNOSIS — R0609 Other forms of dyspnea: Secondary | ICD-10-CM | POA: Insufficient documentation

## 2020-05-31 DIAGNOSIS — Z125 Encounter for screening for malignant neoplasm of prostate: Secondary | ICD-10-CM | POA: Diagnosis not present

## 2020-05-31 DIAGNOSIS — R0600 Dyspnea, unspecified: Secondary | ICD-10-CM

## 2020-05-31 DIAGNOSIS — M19012 Primary osteoarthritis, left shoulder: Secondary | ICD-10-CM | POA: Diagnosis not present

## 2020-05-31 DIAGNOSIS — I739 Peripheral vascular disease, unspecified: Secondary | ICD-10-CM | POA: Diagnosis not present

## 2020-05-31 DIAGNOSIS — Z131 Encounter for screening for diabetes mellitus: Secondary | ICD-10-CM

## 2020-05-31 LAB — POCT GLYCOSYLATED HEMOGLOBIN (HGB A1C): Hemoglobin A1C: 6 % — AB (ref 4.0–5.6)

## 2020-05-31 LAB — POCT GLUCOSE (DEVICE FOR HOME USE): Glucose Fasting, POC: 99 mg/dL (ref 70–99)

## 2020-05-31 NOTE — Progress Notes (Addendum)
Patient ID: Dakota Anthony., male    DOB: December 13, 1965, 54 y.o.   MRN: [id number]   Chief Complaint  Patient presents with  . Spots on Legs   Subjective:    HPI Pt here for spots on legs. Pt states he has had these spots for a while. Mostly on bottom of legs. Pt has discoloration on bottom of legs. Pt was prescribed cream by Dr.keeling but that was helping. Pt has low blood sugar, DM does run in his family.   Pt also having left shoulder pain and bilateral leg pain. Pt has been prescribed Mobic in the past and would like a refill because it does help.    Medical History Dakota Anthony has a past medical history of Anemia, Anxiety, Arthritis, Asthma, Chest discomfort, Chronic back pain, Diabetes mellitus without complication ([place]), GERD (gastroesophageal reflux disease), Left eye injury, Migraine headache, and Shortness of breath dyspnea.   Outpatient Encounter Medications as of 05/31/2020  Medication Sig  . albuterol (PROVENTIL) (2.5 MG/3ML) 0.083% nebulizer solution INHALE 1 VIAL VIA NEBULIZER EVERY 4 HOURS AS NEEDED FOR WHEEZING.  Marland Kitchen albuterol (VENTOLIN HFA) 108 (90 Base) MCG/ACT inhaler 2 puffs every 6 hours prn  . ALPRAZolam (XANAX) 1 MG tablet Take 1-2 tablets (1-2 mg total) by mouth at bedtime as needed. for sleep  . cholestyramine (QUESTRAN) 4 GM/DOSE powder One scoop twice a day with water  . meloxicam (MOBIC) 15 MG tablet Take 1 tablet (15 mg total) by mouth daily.  . [DISCONTINUED] dexamethasone (DECADRON) 4 MG tablet Take 1 tablet (4 mg total) by mouth 2 (two) times daily with a meal.  . [DISCONTINUED] ketoconazole (NIZORAL) 2 % cream Apply 1 application topically 2 (two) times daily.  . [DISCONTINUED] loratadine-pseudoephedrine (CLARITIN-D 12 HOUR) 5-120 MG tablet Take 1 tablet by mouth 2 (two) times daily.   No facility-administered encounter medications on file as of 05/31/2020.     Review of Systems  Constitutional: Negative.   Respiratory: Positive for shortness of  breath.        With exertion at work. (new)  Cardiovascular: Negative.   Gastrointestinal: Negative.   Skin: Positive for color change.       Bilateral lower legs with color changes. Decreased sensation noted with monofilament test. Unable to detect where I was touching in several locations. Warm to touch and pedal pulse present. Describes claudication with climbing stairs at work- resolves with rest.   Neurological: Negative.   Psychiatric/Behavioral: Negative.       Vitals BP 122/84   Pulse 76   Temp 97.6 F (36.4 C)   Ht 5' 11.5" (1.816 m)   Wt 222 lb 9.6 oz (101 kg)   SpO2 97%   BMI 30.61 kg/m   Objective:   Physical Exam Constitutional:      Appearance: Normal appearance.  Cardiovascular:     Rate and Rhythm: Normal rate and regular rhythm.     Heart sounds: Normal heart sounds.  Pulmonary:     Effort: Pulmonary effort is normal.     Breath sounds: Normal breath sounds.  Musculoskeletal:        General: Tenderness present.     Comments: Left shoulder pain for 2 years. Limited ROM. Painful to go through all ROM's.   Skin:    General: Skin is warm and dry.     Comments: Skin discolored bilateral legs. Pain in calves with activity at work, resolves with rest.   Neurological:     Mental Status: He  is alert and oriented to person, place, and time.  Psychiatric:        Mood and Affect: Mood normal.        Behavior: Behavior normal.      Assessment and Plan  1) Intermittent claudication: ABI  2) Dyspnea on exertion: EKG CBC, CMP, Lipid panel  3) Chronic shoulder pain X-ray of left shoulder  4) Impaired glucose on previous labs POC HgA1C 6.0 Lab A1C for most accurate reading.   5) PSA screening  Dakota Anthony presents today after not being seen in a while. He has not had labs done since 2019. Describes bilateral leg claudication with climbing stairs at work- resolves with rest and with elevating his legs. He has skin discoloration on both lower extremities  and decreased sensation.  1) CBC, CMP, PSA, A1C, and Lipid profile today when he leaves here 2) left shoulder x-ray for pain x 2 years 3) ABI: bilateral leg skin discoloaraiton and claudication with activity at work 4) EKG: done in office for shortness of breath with exertion. No acute changes. Compared to 05/19/18 EKG- noted to have slight ST segment elevation at that time and today. Cardiology referral.  Dakota Anthony agrees with plan of care. Will notify him once labs become available and can discuss next steps based on results.  Understands if his symptoms worsen he should go to ED immediately.   Chalmers Guest, NP 05/31/2020

## 2020-06-01 ENCOUNTER — Other Ambulatory Visit: Payer: Self-pay | Admitting: Family Medicine

## 2020-06-01 ENCOUNTER — Telehealth: Payer: Self-pay | Admitting: Family Medicine

## 2020-06-01 LAB — LIPID PANEL
Chol/HDL Ratio: 5.2 ratio — ABNORMAL HIGH (ref 0.0–5.0)
Cholesterol, Total: 188 mg/dL (ref 100–199)
HDL: 36 mg/dL — ABNORMAL LOW (ref >39)
LDL Chol Calc (NIH): 125 mg/dL — ABNORMAL HIGH (ref 0–99)
Triglycerides: 153 mg/dL — ABNORMAL HIGH (ref 0–149)
VLDL Cholesterol Cal: 27 mg/dL (ref 5–40)

## 2020-06-01 LAB — COMPREHENSIVE METABOLIC PANEL
ALT: 59 IU/L — ABNORMAL HIGH (ref 0–44)
AST: 36 IU/L (ref 0–40)
Albumin/Globulin Ratio: 1.6 (ref 1.2–2.2)
Albumin: 4.5 g/dL (ref 3.8–4.9)
Alkaline Phosphatase: 83 IU/L (ref 48–121)
BUN/Creatinine Ratio: 17 (ref 9–20)
BUN: 19 mg/dL (ref 6–24)
Bilirubin Total: 0.3 mg/dL (ref 0.0–1.2)
CO2: 24 mmol/L (ref 20–29)
Calcium: 9.9 mg/dL (ref 8.7–10.2)
Chloride: 104 mmol/L (ref 96–106)
Creatinine, Ser: 1.1 mg/dL (ref 0.76–1.27)
GFR calc Af Amer: 87 mL/min/{1.73_m2} (ref >59)
GFR calc non Af Amer: 75 mL/min/{1.73_m2} (ref >59)
Globulin, Total: 2.9 g/dL (ref 1.5–4.5)
Glucose: 89 mg/dL (ref 65–99)
Potassium: 4.4 mmol/L (ref 3.5–5.2)
Sodium: 140 mmol/L (ref 134–144)
Total Protein: 7.4 g/dL (ref 6.0–8.5)

## 2020-06-01 LAB — CBC WITH DIFFERENTIAL/PLATELET
Basophils Absolute: 0.1 10*3/uL (ref 0.0–0.2)
Basos: 2 %
EOS (ABSOLUTE): 0.9 10*3/uL — ABNORMAL HIGH (ref 0.0–0.4)
Eos: 11 %
Hematocrit: 47.8 % (ref 37.5–51.0)
Hemoglobin: 16.7 g/dL (ref 13.0–17.7)
Immature Grans (Abs): 0 10*3/uL (ref 0.0–0.1)
Immature Granulocytes: 0 %
Lymphocytes Absolute: 2.2 10*3/uL (ref 0.7–3.1)
Lymphs: 28 %
MCH: 32.6 pg (ref 26.6–33.0)
MCHC: 34.9 g/dL (ref 31.5–35.7)
MCV: 93 fL (ref 79–97)
Monocytes Absolute: 0.9 10*3/uL (ref 0.1–0.9)
Monocytes: 11 %
Neutrophils Absolute: 3.8 10*3/uL (ref 1.4–7.0)
Neutrophils: 48 %
Platelets: 289 10*3/uL (ref 150–450)
RBC: 5.13 x10E6/uL (ref 4.14–5.80)
RDW: 12 % (ref 11.6–15.4)
WBC: 7.9 10*3/uL (ref 3.4–10.8)

## 2020-06-01 LAB — PSA: Prostate Specific Ag, Serum: 2 ng/mL (ref 0.0–4.0)

## 2020-06-01 LAB — HEMOGLOBIN A1C
Est. average glucose Bld gHb Est-mCnc: 126 mg/dL
Hgb A1c MFr Bld: 6 % — ABNORMAL HIGH (ref 4.8–5.6)

## 2020-06-01 MED ORDER — MELOXICAM 15 MG PO TABS: 15.0000 mg | ORAL_TABLET | Freq: Every day | ORAL | 3 refills | Status: AC

## 2020-06-01 NOTE — Telephone Encounter (Signed)
Pt is checking xray results and lab work.   Pt also states Santiago Glad said something about medication being called in.   [place], [place]

## 2020-06-01 NOTE — Telephone Encounter (Signed)
Patient notified of x ray results. He is asking for results of his blood work and requests rx for meloxicam to be sent to his pharmacy. Please advise.

## 2020-06-01 NOTE — Telephone Encounter (Signed)
Whitney,  I sent his meloxicam.  He does need to schedule an appointment with Dr. Lovena Le to review his cholesterol results. [place]

## 2020-06-01 NOTE — Telephone Encounter (Signed)
Tried to call -- phone wouldn't ring. See lab results.

## 2020-06-04 ENCOUNTER — Encounter: Payer: Self-pay | Admitting: Family Medicine

## 2020-06-04 ENCOUNTER — Other Ambulatory Visit: Payer: Self-pay | Admitting: *Deleted

## 2020-06-04 DIAGNOSIS — R748 Abnormal levels of other serum enzymes: Secondary | ICD-10-CM

## 2020-06-06 ENCOUNTER — Encounter: Payer: Self-pay | Admitting: Family Medicine

## 2020-06-07 ENCOUNTER — Ambulatory Visit (HOSPITAL_COMMUNITY): Payer: BC Managed Care – PPO

## 2020-06-11 ENCOUNTER — Ambulatory Visit: Payer: BC Managed Care – PPO | Admitting: Family Medicine

## 2020-06-11 ENCOUNTER — Ambulatory Visit (HOSPITAL_COMMUNITY): Payer: BC Managed Care – PPO

## 2020-06-12 ENCOUNTER — Telehealth: Payer: Self-pay | Admitting: Family Medicine

## 2020-06-12 MED ORDER — ALPRAZOLAM 1 MG PO TABS: 1.0000 mg | ORAL_TABLET | Freq: Every evening | ORAL | 0 refills | Status: DC | PRN

## 2020-06-12 NOTE — Telephone Encounter (Signed)
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 [place], [place]   Pt call back [phone]

## 2020-06-13 NOTE — Telephone Encounter (Signed)
Pt checking on nebulizer if script has been faxed to pharmacy.

## 2020-06-13 NOTE — Telephone Encounter (Signed)
Left message notifying pt script was faxed.

## 2020-06-15 ENCOUNTER — Ambulatory Visit (HOSPITAL_COMMUNITY)
Admission: RE | Admit: 2020-06-15 | Discharge: 2020-06-15 | Disposition: A | Discharge status: Home / Self Care | Payer: BC Managed Care – PPO | Source: Ambulatory Visit | Attending: Family Medicine | Admitting: Family Medicine

## 2020-06-15 ENCOUNTER — Ambulatory Visit (HOSPITAL_COMMUNITY): Admission: RE | Admit: 2020-06-15 | Payer: BC Managed Care – PPO | Source: Ambulatory Visit

## 2020-06-15 ENCOUNTER — Other Ambulatory Visit: Payer: Self-pay

## 2020-06-15 DIAGNOSIS — Z8679 Personal history of other diseases of the circulatory system: Secondary | ICD-10-CM | POA: Diagnosis not present

## 2020-06-15 DIAGNOSIS — I739 Peripheral vascular disease, unspecified: Secondary | ICD-10-CM | POA: Insufficient documentation

## 2020-06-19 NOTE — Telephone Encounter (Signed)
error 

## 2020-06-27 ENCOUNTER — Other Ambulatory Visit: Payer: Self-pay | Admitting: Family Medicine

## 2020-06-27 ENCOUNTER — Ambulatory Visit: Payer: BC Managed Care – PPO | Admitting: Family Medicine

## 2020-06-27 MED ORDER — ALPRAZOLAM 1 MG PO TABS: 1.0000 mg | ORAL_TABLET | Freq: Every evening | ORAL | 0 refills | Status: DC | PRN

## 2020-06-27 NOTE — Telephone Encounter (Signed)
Medication pended. Left message to return call 

## 2020-06-27 NOTE — Telephone Encounter (Signed)
Nurses please pend rx for 21 tablets and appropriate pharmacy

## 2020-06-27 NOTE — Telephone Encounter (Signed)
Please advise. Thank you

## 2020-06-27 NOTE — Telephone Encounter (Signed)
Patient was scheduled for today with Dr. Lovena Le.  Patient came in for his appt and I rescheduled him until 07/17/20.  Patient is completely out of Xanax and said that he can't sleep without it and wanted to know if Dr. Nicki Reaper could call him in some until he can get in to see Dr. Lovena Le in a few weeks.  [place]

## 2020-06-28 NOTE — Telephone Encounter (Signed)
Pt.notified

## 2020-07-17 ENCOUNTER — Other Ambulatory Visit: Payer: Self-pay | Admitting: Family Medicine

## 2020-07-17 ENCOUNTER — Ambulatory Visit: Payer: BC Managed Care – PPO | Admitting: Family Medicine

## 2020-07-17 ENCOUNTER — Other Ambulatory Visit: Payer: Self-pay

## 2020-07-17 ENCOUNTER — Encounter: Payer: Self-pay | Admitting: Family Medicine

## 2020-07-17 VITALS — BP 136/88 | HR 82 | Temp 97.8°F | Ht 71.5 in | Wt 221.0 lb

## 2020-07-17 DIAGNOSIS — F5101 Primary insomnia: Secondary | ICD-10-CM | POA: Diagnosis not present

## 2020-07-17 DIAGNOSIS — Z79899 Other long term (current) drug therapy: Secondary | ICD-10-CM | POA: Diagnosis not present

## 2020-07-17 DIAGNOSIS — M25512 Pain in left shoulder: Secondary | ICD-10-CM

## 2020-07-17 DIAGNOSIS — R7303 Prediabetes: Secondary | ICD-10-CM

## 2020-07-17 DIAGNOSIS — E782 Mixed hyperlipidemia: Secondary | ICD-10-CM

## 2020-07-17 DIAGNOSIS — G8929 Other chronic pain: Secondary | ICD-10-CM

## 2020-07-17 LAB — MED LIST OPTION NOT SELECTED

## 2020-07-17 MED ORDER — ALPRAZOLAM 1 MG PO TABS: ORAL_TABLET | ORAL | 0 refills | Status: DC

## 2020-07-17 MED ORDER — TRAZODONE HCL 50 MG PO TABS: ORAL_TABLET | ORAL | 1 refills | Status: DC

## 2020-07-17 NOTE — Progress Notes (Signed)
Patient ID: Dakota Sneddon., male    DOB: 04/24/1966, 54 y.o.   MRN: [id number]   Chief Complaint  Patient presents with  . Anxiety  . Results    lab  . Prediabetes   Subjective:    HPI  follow up on bloodwork and xray results. Needs refill on xanax. States he has been out for 3 -4 nights.  Pt stating left shoulder pain is feeling better and taking melxoicam daily.  a1c 6.0.  Drinking lots of soda, and eating breads. Drinking two of 2 L. Of soda.  Taking xanax since 2012.  Started due to insomnia. In chart h/o GAD and insomnia. Mind racing at night. Taking 1-2 per night of xanax 1mg . Not waking up during the night. Getting up at 5:30am. Last fill was on 06/28/20 for 15 tab, and pt stating taking more than prescribed.  At times taking 2 per night.  Anxiety/insomnia- Willing to try to taper down off xanax and try trazodone.  Saw NP Santiago Glad in 8/21 for left shoulder pain, xray showing OA in [facility] joint and given mobic.  Helping some.  Medical History Elmon has a past medical history of Anemia, Anxiety, Arthritis, Asthma, Chest discomfort, Chronic back pain, Diabetes mellitus without complication ([place]), GERD (gastroesophageal reflux disease), Left eye injury, Migraine headache, and Shortness of breath dyspnea.   Outpatient Encounter Medications as of 07/17/2020  Medication Sig  . albuterol (PROVENTIL) (2.5 MG/3ML) 0.083% nebulizer solution INHALE 1 VIAL VIA NEBULIZER EVERY 4 HOURS AS NEEDED FOR WHEEZING.  . ALPRAZolam (XANAX) 1 MG tablet Take 1 tab p.o. qhs for 7 days then take 1/2 tab p.o. qhs for 7 days, then take 1/2 tab qhs every other day for 1 wk. Then discontinue.  . cholestyramine (QUESTRAN) 4 GM/DOSE powder One scoop twice a day with water  . meloxicam (MOBIC) 15 MG tablet Take 1 tablet (15 mg total) by mouth daily.  . [DISCONTINUED] albuterol (VENTOLIN HFA) 108 (90 Base) MCG/ACT inhaler 2 puffs every 6 hours prn  . [DISCONTINUED] ALPRAZolam (XANAX) 1 MG tablet Take  1 tablet (1 mg total) by mouth at bedtime as needed. for sleep  . traZODone (DESYREL) 50 MG tablet Take 1-2 p.o. qhs prn insomnia.   No facility-administered encounter medications on file as of 07/17/2020.     Review of Systems  Constitutional: Negative for chills and fever.  HENT: Negative for congestion, rhinorrhea and sore throat.   Respiratory: Negative for cough, shortness of breath and wheezing.   Cardiovascular: Negative for chest pain and leg swelling.  Gastrointestinal: Negative for abdominal pain, diarrhea, nausea and vomiting.  Genitourinary: Negative for dysuria and frequency.  Skin: Negative for rash.  Neurological: Negative for dizziness, weakness and headaches.  Psychiatric/Behavioral: Positive for sleep disturbance. The patient is nervous/anxious.      Vitals BP 136/88   Pulse 82   Temp 97.8 F (36.6 C)   Ht 5' 11.5" (1.816 m)   Wt 221 lb (100.2 kg)   SpO2 98%   BMI 30.39 kg/m   Objective:   Physical Exam Vitals and nursing note reviewed.  Constitutional:      General: He is not in acute distress.    Appearance: Normal appearance. He is not ill-appearing.  HENT:     Head: Normocephalic.     Nose: Nose normal. No congestion.     Mouth/Throat:     Mouth: Mucous membranes are moist.     Pharynx: No oropharyngeal exudate.  Eyes:  Extraocular Movements: Extraocular movements intact.     Conjunctiva/sclera: Conjunctivae normal.     Pupils: Pupils are equal, round, and reactive to light.  Cardiovascular:     Rate and Rhythm: Normal rate and regular rhythm.     Pulses: Normal pulses.     Heart sounds: Normal heart sounds. No murmur heard.   Pulmonary:     Effort: Pulmonary effort is normal.     Breath sounds: Normal breath sounds. No wheezing, rhonchi or rales.  Musculoskeletal:        General: Normal range of motion.     Right lower leg: No edema.     Left lower leg: No edema.  Skin:    General: Skin is warm and dry.     Findings: No rash.    Neurological:     General: No focal deficit present.     Mental Status: He is alert and oriented to person, place, and time.     Cranial Nerves: No cranial nerve deficit.  Psychiatric:        Mood and Affect: Mood normal.        Behavior: Behavior normal.        Thought Content: Thought content normal.        Judgment: Judgment normal.      Assessment and Plan   1. Primary insomnia - traZODone (DESYREL) 50 MG tablet; Take 1-2 p.o. qhs prn insomnia.  Dispense: 45 tablet; Refill: 1 - ALPRAZolam (XANAX) 1 MG tablet; Take 1 tab p.o. qhs for 7 days then take 1/2 tab p.o. qhs for 7 days, then take 1/2 tab qhs every other day for 1 wk. Then discontinue.  Dispense: 15 tablet; Refill: 0 - ToxASSURE Select 13 (MW), Urine - Med List Option Not Selected - Specimen status report  2. Prediabetes  3. Chronic left shoulder pain  4. Mixed hyperlipidemia  5. High risk medication use - ToxASSURE Select 13 (MW), Urine   Anxiety/insomnia- Long discussion about the need to try to decrease off xanax and try alternative for insomnia.   pmp reviewed.   Gave tapering dose of xanax.  Gave script to start trazodone. Controlled agreement given and UDS ordered.  hld- slight inc in TG and LDL.  Cont to watch carbs and inc in exercising.  Pre diabetes- a1c at 6.0.  advisign to increase in exercising 59mins 5x per week and dec carb intake, handout given.  Pt taking mobic for chronic left shoulder pain. Xray- showing moderate OA in AC joint.    F/u 33mo or prn.

## 2020-07-17 NOTE — Patient Instructions (Addendum)
Take tapering dose of xanax starting tonight as prescribed.  Also take trazodone 50mg  tablet starting tonight, and after 1 wk of taking 50mg  trazodone then go up to 100mg  per night.  Continue to taper off the xanax as written.  Call if having any concerns.     Prediabetes Eating Plan Prediabetes is a condition that causes blood sugar (glucose) levels to be higher than normal. This increases the risk for developing diabetes. In order to prevent diabetes from developing, your health care provider may recommend a diet and other lifestyle changes to help you:  Control your blood glucose levels.  Improve your cholesterol levels.  Manage your blood pressure. Your health care provider may recommend working with a diet and nutrition specialist (dietitian) to make a meal plan that is best for you. What are tips for following this plan? Lifestyle  Set weight loss goals with the help of your health care team. It is recommended that most people with prediabetes lose 7% of their current body weight.  Exercise for at least 30 minutes at least 5 days a week.  Attend a support group or seek ongoing support from a mental health counselor.  Take over-the-counter and prescription medicines only as told by your health care provider. Reading food labels  Read food labels to check the amount of fat, salt (sodium), and sugar in prepackaged foods. Avoid foods that have: ? Saturated fats. ? Trans fats. ? Added sugars.  Avoid foods that have more than 300 milligrams (mg) of sodium per serving. Limit your daily sodium intake to less than 2,300 mg each day. Shopping  Avoid buying pre-made and processed foods. Cooking  Cook with olive oil. Do not use butter, lard, or ghee.  Bake, broil, grill, or boil foods. Avoid frying. Meal planning   Work with your dietitian to develop an eating plan that is right for you. This may include: ? Tracking how many calories you take in. Use a food diary, notebook, or  mobile application to track what you eat at each meal. ? Using the glycemic index (GI) to plan your meals. The index tells you how quickly a food will raise your blood glucose. Choose low-GI foods. These foods take a longer time to raise blood glucose.  Consider following a Mediterranean diet. This diet includes: ? Several servings each day of fresh fruits and vegetables. ? Eating fish at least twice a week. ? Several servings each day of whole grains, beans, nuts, and seeds. ? Using olive oil instead of other fats. ? Moderate alcohol consumption. ? Eating small amounts of red meat and whole-fat dairy.  If you have high blood pressure, you may need to limit your sodium intake or follow a diet such as the DASH eating plan. DASH is an eating plan that aims to lower high blood pressure. What foods are recommended? The items listed below may not be a complete list. Talk with your dietitian about what dietary choices are best for you. Grains Whole grains, such as whole-wheat or whole-grain breads, crackers, cereals, and pasta. Unsweetened oatmeal. Bulgur. Barley. Quinoa. Brown rice. Corn or whole-wheat flour tortillas or taco shells. Vegetables Lettuce. Spinach. Peas. Beets. Cauliflower. Cabbage. Broccoli. Carrots. Tomatoes. Squash. Eggplant. Herbs. Peppers. Onions. Cucumbers. Brussels sprouts. Fruits Berries. Bananas. Apples. Oranges. Grapes. Papaya. Mango. Pomegranate. Kiwi. Grapefruit. Cherries. Meats and other protein foods Seafood. Poultry without skin. Lean cuts of pork and beef. Tofu. Eggs. Nuts. Beans. Dairy Low-fat or fat-free dairy products, such as yogurt, cottage cheese, and cheese.  Beverages Water. Tea. Coffee. Sugar-free or diet soda. Seltzer water. Lowfat or no-fat milk. Milk alternatives, such as soy or almond milk. Fats and oils Olive oil. Canola oil. Sunflower oil. Grapeseed oil. Avocado. Walnuts. Sweets and desserts Sugar-free or low-fat pudding. Sugar-free or low-fat ice  cream and other frozen treats. Seasoning and other foods Herbs. Sodium-free spices. Mustard. Relish. Low-fat, low-sugar ketchup. Low-fat, low-sugar barbecue sauce. Low-fat or fat-free mayonnaise. What foods are not recommended? The items listed below may not be a complete list. Talk with your dietitian about what dietary choices are best for you. Grains Refined white flour and flour products, such as bread, pasta, snack foods, and cereals. Vegetables Canned vegetables. Frozen vegetables with butter or cream sauce. Fruits Fruits canned with syrup. Meats and other protein foods Fatty cuts of meat. Poultry with skin. Breaded or fried meat. Processed meats. Dairy Full-fat yogurt, cheese, or milk. Beverages Sweetened drinks, such as sweet iced tea and soda. Fats and oils Butter. Lard. Ghee. Sweets and desserts Baked goods, such as cake, cupcakes, pastries, cookies, and cheesecake. Seasoning and other foods Spice mixes with added salt. Ketchup. Barbecue sauce. Mayonnaise. Summary  To prevent diabetes from developing, you may need to make diet and other lifestyle changes to help control blood sugar, improve cholesterol levels, and manage your blood pressure.  Set weight loss goals with the help of your health care team. It is recommended that most people with prediabetes lose 7 percent of their current body weight.  Consider following a Mediterranean diet that includes plenty of fresh fruits and vegetables, whole grains, beans, nuts, seeds, fish, lean meat, low-fat dairy, and healthy oils. This information is not intended to replace advice given to you by your health care provider. Make sure you discuss any questions you have with your health care provider. Document Revised: 01/28/2019 Document Reviewed: 12/10/2016 Elsevier Patient Education  2020 [employer].

## 2020-07-18 NOTE — Telephone Encounter (Signed)
Does pt have asthma? Did he pulmonary function studies in past?  Thx, dr. Lovena Le

## 2020-07-20 ENCOUNTER — Other Ambulatory Visit: Payer: Self-pay

## 2020-07-20 LAB — SPECIMEN STATUS REPORT

## 2020-07-20 LAB — TOXASSURE SELECT 13 (MW), URINE

## 2020-07-20 MED ORDER — ALBUTEROL SULFATE HFA 108 (90 BASE) MCG/ACT IN AERS: INHALATION_SPRAY | RESPIRATORY_TRACT | 2 refills | Status: DC

## 2020-07-20 NOTE — Telephone Encounter (Signed)
Need refill on albuterol (VENTOLIN HFA) 108 ([place], [place]  Pt call back [phone]

## 2020-08-16 ENCOUNTER — Other Ambulatory Visit: Payer: Self-pay | Admitting: Family Medicine

## 2020-08-21 ENCOUNTER — Telehealth: Payer: Self-pay

## 2020-08-21 DIAGNOSIS — F5101 Primary insomnia: Secondary | ICD-10-CM

## 2020-08-21 MED ORDER — ALBUTEROL SULFATE (2.5 MG/3ML) 0.083% IN NEBU
INHALATION_SOLUTION | RESPIRATORY_TRACT | 5 refills | Status: DC
Start: 2020-08-21 — End: 2021-01-25

## 2020-08-21 NOTE — Telephone Encounter (Signed)
Patient is requesting a prescription for albuterol for his neb machinel called into  [place]

## 2020-08-21 NOTE — Telephone Encounter (Signed)
Pls send this script thx. 90 day supply with 1 refill. Dr. Darene Lamer

## 2020-08-21 NOTE — Telephone Encounter (Signed)
Patient notified

## 2020-08-21 NOTE — Telephone Encounter (Signed)
Patient notified med was sent. Patient states since stopping the xanax and taking 2 trazodone per night he has had trouble falling asleep every night, he will lay there for hours until he finally dozes off. Is there anything else you recommend.

## 2020-08-22 NOTE — Telephone Encounter (Signed)
Recommending taking 150mg  trazodone, let us know if he wants the new script called in.  Sometimes it takes around 150-200mg  trazodone to get to therapeutic level.    Dr. Lovena Le

## 2020-08-23 MED ORDER — TRAZODONE HCL 150 MG PO TABS: ORAL_TABLET | ORAL | 2 refills | Status: DC

## 2020-08-23 NOTE — Telephone Encounter (Signed)
Pt contacted and states that he would like script sent to pharmacy. Please advise. Thank you

## 2020-08-23 NOTE — Addendum Note (Signed)
Addended by: Erven Colla on: 08/23/2020 01:55 PM   Modules accepted: Orders

## 2020-10-01 ENCOUNTER — Other Ambulatory Visit: Payer: Self-pay | Admitting: Family Medicine

## 2020-10-16 ENCOUNTER — Ambulatory Visit (INDEPENDENT_AMBULATORY_CARE_PROVIDER_SITE_OTHER): Payer: BC Managed Care – PPO | Admitting: Family Medicine

## 2020-10-16 DIAGNOSIS — Z5329 Procedure and treatment not carried out because of patient's decision for other reasons: Secondary | ICD-10-CM

## 2020-12-11 ENCOUNTER — Other Ambulatory Visit: Payer: Self-pay | Admitting: Family Medicine

## 2020-12-11 DIAGNOSIS — F5101 Primary insomnia: Secondary | ICD-10-CM

## 2021-01-15 ENCOUNTER — Telehealth: Payer: Self-pay | Admitting: Family Medicine

## 2021-01-15 DIAGNOSIS — F5101 Primary insomnia: Secondary | ICD-10-CM

## 2021-01-17 NOTE — Telephone Encounter (Signed)
Needs follow up appt, last visit 76mo ago. Dr. Lovena Le

## 2021-01-18 NOTE — Telephone Encounter (Signed)
Please contact patient to have him set up appt. Thank you!

## 2021-01-21 ENCOUNTER — Other Ambulatory Visit: Payer: Self-pay | Admitting: Family Medicine

## 2021-01-21 NOTE — Telephone Encounter (Signed)
Please contact patient to have him set up appt; then may route back to nurses. Thank you

## 2021-01-24 NOTE — Telephone Encounter (Signed)
Schedule appointment?

## 2021-01-24 NOTE — Telephone Encounter (Signed)
patient scheduled appointment on 5/3

## 2021-02-19 ENCOUNTER — Ambulatory Visit: Payer: BC Managed Care – PPO | Admitting: Family Medicine

## 2021-03-15 ENCOUNTER — Other Ambulatory Visit: Payer: Self-pay | Admitting: Family Medicine

## 2021-06-28 ENCOUNTER — Encounter: Payer: Self-pay | Admitting: *Deleted

## 2021-07-23 DIAGNOSIS — J45909 Unspecified asthma, uncomplicated: Secondary | ICD-10-CM | POA: Diagnosis not present

## 2021-07-23 DIAGNOSIS — Z13220 Encounter for screening for lipoid disorders: Secondary | ICD-10-CM | POA: Diagnosis not present

## 2021-07-23 DIAGNOSIS — Z79899 Other long term (current) drug therapy: Secondary | ICD-10-CM | POA: Diagnosis not present

## 2021-07-23 DIAGNOSIS — Z125 Encounter for screening for malignant neoplasm of prostate: Secondary | ICD-10-CM | POA: Diagnosis not present

## 2022-09-01 ENCOUNTER — Encounter: Payer: Self-pay | Admitting: Emergency Medicine

## 2022-09-01 ENCOUNTER — Other Ambulatory Visit: Payer: Self-pay

## 2022-09-01 ENCOUNTER — Ambulatory Visit
Admission: EM | Admit: 2022-09-01 | Discharge: 2022-09-01 | Disposition: A | Discharge status: Home / Self Care | Payer: BC Managed Care – PPO | Attending: Family Medicine | Admitting: Family Medicine

## 2022-09-01 DIAGNOSIS — K047 Periapical abscess without sinus: Secondary | ICD-10-CM

## 2022-09-01 MED ORDER — CHLORHEXIDINE GLUCONATE 0.12 % MT SOLN: 15.0000 mL | Freq: Two times a day (BID) | OROMUCOSAL | 0 refills | Status: AC

## 2022-09-01 MED ORDER — AMOXICILLIN-POT CLAVULANATE 875-125 MG PO TABS: 1.0000 | ORAL_TABLET | Freq: Two times a day (BID) | ORAL | 0 refills | Status: AC

## 2022-09-01 NOTE — ED Provider Notes (Signed)
[facility]    CSN: [id number] Arrival date & time: 09/01/22  1455      History   Chief Complaint Chief Complaint  Patient presents with   Dental Pain    HPI Dakota Sherk. is a 56 y.o. male.   Patient presenting today with left lower molar pain for the past week or so in an area where he has a known broken molar.  States the pain is worsening and is now feeling like his gums are swollen and infected.  Denies drainage, bleeding, difficulty breathing or swallowing, headache, fever, nausea, vomiting.  Working on getting a Pharmacist, community to see him.  Taking over-the-counter pain relievers with minimal temporary relief of symptoms.    Past Medical History:  Diagnosis Date   Anemia    age 50  had to take iron shots   Anxiety    Possible bipolar disorder   Arthritis    hands   Asthma    Chest discomfort    Chronic back pain    Diabetes mellitus without complication (HCC)    hypoglycemia   GERD (gastroesophageal reflux disease)    tums occassionally   Left eye injury    Traumatic damage   Migraine headache    Shortness of breath dyspnea     Patient Active Problem List   Diagnosis Date Noted   Prediabetes 07/17/2020   Intermittent claudication ([place]) 05/31/2020   Dyspnea on exertion 05/31/2020   Chronic left shoulder pain 05/31/2020   Generalized anxiety disorder 08/03/2017   Insomnia 08/08/2016   Acute appendicitis 01/03/2016   MIGRAINE HEADACHE 01/17/2010   CHEST PAIN 01/17/2010   Anxiety state 01/16/2010    Past Surgical History:  Procedure Laterality Date   APPENDECTOMY     CATARACT EXTRACTION     From left eye following injury as a teenager   CHOLECYSTECTOMY  10/31/2011   Procedure: LAPAROSCOPIC CHOLECYSTECTOMY;  Surgeon: Jamesetta So;  Location: [facility];  Service: General;  Laterality: N/A;   COLONOSCOPY N/A 07/17/2016   Procedure: COLONOSCOPY;  Surgeon: Daneil Dolin, MD;  Location: [facility];  Service: Endoscopy;  Laterality: N/A;  1:15  PM   LAPAROSCOPIC APPENDECTOMY N/A 01/03/2016   Procedure: APPENDECTOMY LAPAROSCOPIC;  Surgeon: Aviva Signs, MD;  Location: [facility];  Service: General;  Laterality: N/A;       Home Medications    Prior to Admission medications   Medication Sig Start Date End Date Taking? Authorizing Provider  amoxicillin-clavulanate (AUGMENTIN) 875-125 MG tablet Take 1 tablet by mouth every 12 (twelve) hours. 09/01/22  Yes Volney American, PA-C  chlorhexidine (PERIDEX) 0.12 % solution Use as directed 15 mLs in the mouth or throat 2 (two) times daily. 09/01/22  Yes Volney American, PA-C  albuterol (PROVENTIL) (2.5 MG/3ML) 0.083% nebulizer solution INHALE 1 VIAL VIA NEBULIZER EVERY 4 HOURS AS NEEDED FOR WHEEZING. 01/25/21   Lovena Le, Malena M, DO  albuterol (VENTOLIN HFA) 108 (90 Base) MCG/ACT inhaler INHALE 2 PUFFS INTO THE LUNGS EVERY 6 HOURS AS NEEDED. 01/25/21   Elvia Collum M, DO  cholestyramine Lucrezia Starch) 4 GM/DOSE powder One scoop twice a day with water 03/23/19   Mikey Kirschner, MD  meloxicam (MOBIC) 15 MG tablet Take 1 tablet (15 mg total) by mouth daily. 06/01/20   Chalmers Guest, FNP  traZODone (DESYREL) 150 MG tablet TAKE 1 TABLET BY MOUTH ONCE DAILY AT BEDTIME AS NEEDED FOR SLEEP. 01/17/21   Erven Colla, DO    Family History  Family History  Problem Relation Age of Onset   Kidney failure Mother    Hypertension Mother    Diabetes Mother    Cancer Father    Cirrhosis Father        Hepatic   Colon cancer Paternal Uncle    Anesthesia problems Neg Hx    Hypotension Neg Hx    Malignant hyperthermia Neg Hx    Pseudochol deficiency Neg Hx     Social History Social History   Tobacco Use   Smoking status: Never   Smokeless tobacco: Former    Types: Chew  Substance Use Topics   Alcohol use: No    Comment: No excessive use   Drug use: No     Allergies   Patient has no known allergies.   Review of Systems Review of Systems Per HPI  Physical Exam Triage Vital  Signs ED Triage Vitals [09/01/22 1630]  Enc Vitals Group     BP (!) 168/95     Pulse Rate 94     Resp 20     Temp 97.7 F (36.5 C)     Temp Source Oral     SpO2 94 %     Weight      Height      Head Circumference      Peak Flow      Pain Score 6     Pain Loc      Pain Edu?      Excl. in [place]?    No data found.  Updated Vital Signs BP (!) 168/95 (BP Location: Right Arm)   Pulse 94   Temp 97.7 F (36.5 C) (Oral)   Resp 20   SpO2 94%   Visual Acuity Right Eye Distance:   Left Eye Distance:   Bilateral Distance:    Right Eye Near:   Left Eye Near:    Bilateral Near:     Physical Exam Vitals and nursing note reviewed.  Constitutional:      Appearance: Normal appearance.  HENT:     Head: Atraumatic.     Mouth/Throat:     Mouth: Mucous membranes are moist.     Comments: Obvious broken molar left lower jaw with surrounding gingival erythema, edema Eyes:     Extraocular Movements: Extraocular movements intact.     Conjunctiva/sclera: Conjunctivae normal.  Cardiovascular:     Rate and Rhythm: Normal rate and regular rhythm.  Pulmonary:     Effort: Pulmonary effort is normal.     Breath sounds: Normal breath sounds.  Musculoskeletal:        General: Normal range of motion.     Cervical back: Normal range of motion and neck supple.  Skin:    General: Skin is warm and dry.  Neurological:     General: No focal deficit present.     Mental Status: He is oriented to person, place, and time.  Psychiatric:        Mood and Affect: Mood normal.        Thought Content: Thought content normal.        Judgment: Judgment normal.    UC Treatments / Results  Labs (all labs ordered are listed, but only abnormal results are displayed) Labs Reviewed - No data to display  EKG   Radiology No results found.  Procedures Procedures (including critical care time)  Medications Ordered in UC Medications - No data to display  Initial Impression / Assessment and Plan / UC  Course  I have reviewed the triage vital signs and the nursing notes.  Pertinent labs & imaging results that were available during my care of the patient were reviewed by me and considered in my medical decision making (see chart for details).     Treat with Augmentin, Peridex, over-the-counter pain relievers until able to get in with a dentist for further management.  Return for worsening symptoms.  Final Clinical Impressions(s) / UC Diagnoses   Final diagnoses:  Dental infection   Discharge Instructions   None    ED Prescriptions     Medication Sig Dispense Auth. Provider   amoxicillin-clavulanate (AUGMENTIN) 875-125 MG tablet Take 1 tablet by mouth every 12 (twelve) hours. 14 tablet Volney American, [place]   chlorhexidine (PERIDEX) 0.12 % solution Use as directed 15 mLs in the mouth or throat 2 (two) times daily. 120 mL Volney American, [place]      PDMP not reviewed this encounter.   Volney American, [place] 09/01/22 1648

## 2022-09-01 NOTE — ED Triage Notes (Signed)
Pt reports left lower dental pain since last Friday. Denies any known injury.
